# Patient Record
Sex: Female | Born: 1945 | Race: White | Hispanic: No | Marital: Married | Smoking: Never smoker
Health system: Southern US, Community
[De-identification: ages and names within clinical notes are randomized; demographics above are authoritative.]

## PROBLEM LIST (undated history)

## (undated) DIAGNOSIS — F32A Depression, unspecified: Secondary | ICD-10-CM

## (undated) DIAGNOSIS — F329 Major depressive disorder, single episode, unspecified: Secondary | ICD-10-CM

## (undated) DIAGNOSIS — D649 Anemia, unspecified: Secondary | ICD-10-CM

## (undated) DIAGNOSIS — I10 Essential (primary) hypertension: Secondary | ICD-10-CM

## (undated) DIAGNOSIS — R001 Bradycardia, unspecified: Secondary | ICD-10-CM

## (undated) DIAGNOSIS — Z950 Presence of cardiac pacemaker: Secondary | ICD-10-CM

## (undated) HISTORY — PX: INSERT / REPLACE / REMOVE PACEMAKER: SUR710

## (undated) HISTORY — PX: ABDOMINAL HYSTERECTOMY: SHX81

## (undated) HISTORY — PX: ROTATOR CUFF REPAIR: SHX139

## (undated) HISTORY — PX: HERNIA REPAIR: SHX51

---

## 2015-08-06 ENCOUNTER — Observation Stay: Payer: Medicare Other

## 2015-08-06 ENCOUNTER — Encounter
Admission: EM | Disposition: A | Discharge status: Other Acute Inpatient Hospital | Payer: Self-pay | Source: Home / Self Care | Attending: Internal Medicine

## 2015-08-06 ENCOUNTER — Emergency Department: Payer: Medicare Other

## 2015-08-06 ENCOUNTER — Observation Stay (HOSPITAL_COMMUNITY)
Admit: 2015-08-06 | Discharge: 2015-08-06 | Disposition: A | Discharge status: Home / Self Care | Payer: Medicare Other | Attending: Internal Medicine | Admitting: Internal Medicine

## 2015-08-06 ENCOUNTER — Inpatient Hospital Stay
Admission: EM | Admit: 2015-08-06 | Discharge: 2015-08-07 | DRG: 287 | Disposition: A | Discharge status: Other Acute Inpatient Hospital | Payer: Medicare Other | Attending: Internal Medicine | Admitting: Internal Medicine

## 2015-08-06 ENCOUNTER — Encounter: Payer: Self-pay | Admitting: Emergency Medicine

## 2015-08-06 DIAGNOSIS — I442 Atrioventricular block, complete: Principal | ICD-10-CM | POA: Diagnosis present

## 2015-08-06 DIAGNOSIS — Z90710 Acquired absence of both cervix and uterus: Secondary | ICD-10-CM | POA: Diagnosis not present

## 2015-08-06 DIAGNOSIS — I455 Other specified heart block: Secondary | ICD-10-CM | POA: Diagnosis not present

## 2015-08-06 DIAGNOSIS — R079 Chest pain, unspecified: Secondary | ICD-10-CM | POA: Diagnosis present

## 2015-08-06 DIAGNOSIS — Z7982 Long term (current) use of aspirin: Secondary | ICD-10-CM

## 2015-08-06 DIAGNOSIS — Z79899 Other long term (current) drug therapy: Secondary | ICD-10-CM | POA: Diagnosis not present

## 2015-08-06 DIAGNOSIS — R55 Syncope and collapse: Secondary | ICD-10-CM | POA: Diagnosis present

## 2015-08-06 DIAGNOSIS — R002 Palpitations: Secondary | ICD-10-CM | POA: Diagnosis not present

## 2015-08-06 DIAGNOSIS — I495 Sick sinus syndrome: Secondary | ICD-10-CM | POA: Diagnosis not present

## 2015-08-06 DIAGNOSIS — R200 Anesthesia of skin: Secondary | ICD-10-CM | POA: Diagnosis not present

## 2015-08-06 DIAGNOSIS — R634 Abnormal weight loss: Secondary | ICD-10-CM | POA: Diagnosis not present

## 2015-08-06 DIAGNOSIS — R109 Unspecified abdominal pain: Secondary | ICD-10-CM

## 2015-08-06 DIAGNOSIS — R001 Bradycardia, unspecified: Secondary | ICD-10-CM | POA: Diagnosis not present

## 2015-08-06 DIAGNOSIS — I200 Unstable angina: Secondary | ICD-10-CM | POA: Diagnosis present

## 2015-08-06 DIAGNOSIS — I10 Essential (primary) hypertension: Secondary | ICD-10-CM | POA: Diagnosis present

## 2015-08-06 DIAGNOSIS — Z8249 Family history of ischemic heart disease and other diseases of the circulatory system: Secondary | ICD-10-CM | POA: Diagnosis not present

## 2015-08-06 DIAGNOSIS — I25110 Atherosclerotic heart disease of native coronary artery with unstable angina pectoris: Secondary | ICD-10-CM

## 2015-08-06 HISTORY — PX: CARDIAC CATHETERIZATION: SHX172

## 2015-08-06 HISTORY — DX: Essential (primary) hypertension: I10

## 2015-08-06 LAB — BASIC METABOLIC PANEL
Anion gap: 8 (ref 5–15)
BUN: 16 mg/dL (ref 6–20)
CHLORIDE: 104 mmol/L (ref 101–111)
CO2: 26 mmol/L (ref 22–32)
Calcium: 9.2 mg/dL (ref 8.9–10.3)
Creatinine, Ser: 0.6 mg/dL (ref 0.44–1.00)
GFR calc non Af Amer: >60 mL/min (ref >60)
Glucose, Bld: 128 mg/dL — ABNORMAL HIGH (ref 65–99)
POTASSIUM: 3.8 mmol/L (ref 3.5–5.1)
SODIUM: 138 mmol/L (ref 135–145)

## 2015-08-06 LAB — TROPONIN I
TROPONIN I: 0.03 ng/mL (ref <0.031)
Troponin I: 0.03 ng/mL (ref <0.031)
Troponin I: <0.03 ng/mL (ref <0.031)
Troponin I: 0.03 ng/mL (ref <0.031)

## 2015-08-06 LAB — MAGNESIUM: Magnesium: 1.9 mg/dL (ref 1.7–2.4)

## 2015-08-06 LAB — T4, FREE: FREE T4: 0.91 ng/dL (ref 0.61–1.12)

## 2015-08-06 LAB — CBC
HEMATOCRIT: 39.9 % (ref 35.0–47.0)
HEMOGLOBIN: 13.9 g/dL (ref 12.0–16.0)
MCH: 31.4 pg (ref 26.0–34.0)
MCHC: 34.8 g/dL (ref 32.0–36.0)
MCV: 90.1 fL (ref 80.0–100.0)
Platelets: 220 10*3/uL (ref 150–440)
RBC: 4.42 MIL/uL (ref 3.80–5.20)
RDW: 13.3 % (ref 11.5–14.5)
WBC: 6.7 10*3/uL (ref 3.6–11.0)

## 2015-08-06 LAB — TSH: TSH: 4.672 u[IU]/mL — ABNORMAL HIGH (ref 0.350–4.500)

## 2015-08-06 LAB — GLUCOSE, CAPILLARY: GLUCOSE-CAPILLARY: 97 mg/dL (ref 65–99)

## 2015-08-06 SURGERY — LEFT HEART CATH AND CORONARY ANGIOGRAPHY
Anesthesia: Moderate Sedation

## 2015-08-06 MED ORDER — SODIUM CHLORIDE 0.9% FLUSH: 3.0000 mL | INTRAVENOUS | Status: DC | PRN

## 2015-08-06 MED ORDER — ENOXAPARIN SODIUM 40 MG/0.4ML ~~LOC~~ SOLN
40.0000 mg | SUBCUTANEOUS | Status: DC
  Administered 2015-08-06: 40 mg via SUBCUTANEOUS
  Filled 2015-08-06: qty 0.4

## 2015-08-06 MED ORDER — SODIUM CHLORIDE 0.9 % WEIGHT BASED INFUSION: 1.0000 mL/kg/h | INTRAVENOUS | Status: DC

## 2015-08-06 MED ORDER — MIDAZOLAM HCL 2 MG/2ML IJ SOLN
INTRAMUSCULAR | Status: DC | PRN
  Administered 2015-08-06: 1 mg via INTRAVENOUS

## 2015-08-06 MED ORDER — ZOLPIDEM TARTRATE 5 MG PO TABS
10.0000 mg | ORAL_TABLET | Freq: Every day | ORAL | Status: DC
  Administered 2015-08-06: 10 mg via ORAL
  Filled 2015-08-06: qty 2

## 2015-08-06 MED ORDER — ONDANSETRON HCL 4 MG/2ML IJ SOLN
4.0000 mg | Freq: Four times a day (QID) | INTRAMUSCULAR | Status: DC | PRN
Start: 2015-08-06 — End: 2015-08-07
  Administered 2015-08-06: 4 mg via INTRAVENOUS
  Filled 2015-08-06: qty 2

## 2015-08-06 MED ORDER — FENTANYL CITRATE (PF) 100 MCG/2ML IJ SOLN
INTRAMUSCULAR | Status: DC | PRN
  Administered 2015-08-06: 25 ug via INTRAVENOUS

## 2015-08-06 MED ORDER — SODIUM CHLORIDE 0.9 % IV SOLN: 250.0000 mL | INTRAVENOUS | Status: DC | PRN

## 2015-08-06 MED ORDER — MORPHINE SULFATE (PF) 2 MG/ML IV SOLN
2.0000 mg | INTRAVENOUS | Status: DC | PRN
  Administered 2015-08-06: 2 mg via INTRAVENOUS
  Filled 2015-08-06: qty 1

## 2015-08-06 MED ORDER — ACETAMINOPHEN 325 MG PO TABS
650.0000 mg | ORAL_TABLET | ORAL | Status: DC | PRN
  Administered 2015-08-06: 650 mg via ORAL
  Filled 2015-08-06: qty 2

## 2015-08-06 MED ORDER — MORPHINE SULFATE (PF) 2 MG/ML IV SOLN
2.0000 mg | INTRAVENOUS | Status: DC | PRN
  Administered 2015-08-06 – 2015-08-07 (×6): 2 mg via INTRAVENOUS
  Filled 2015-08-06 (×6): qty 1

## 2015-08-06 MED ORDER — SODIUM CHLORIDE 0.9% FLUSH
3.0000 mL | Freq: Two times a day (BID) | INTRAVENOUS | Status: DC
  Administered 2015-08-06 – 2015-08-07 (×2): 3 mL via INTRAVENOUS

## 2015-08-06 MED ORDER — ONDANSETRON HCL 4 MG/2ML IJ SOLN
4.0000 mg | Freq: Once | INTRAMUSCULAR | Status: AC
  Administered 2015-08-06: 4 mg via INTRAVENOUS
  Filled 2015-08-06: qty 2

## 2015-08-06 MED ORDER — MIDAZOLAM HCL 2 MG/2ML IJ SOLN
INTRAMUSCULAR | Status: AC
  Filled 2015-08-06: qty 2

## 2015-08-06 MED ORDER — ASPIRIN 81 MG PO CHEW
81.0000 mg | CHEWABLE_TABLET | ORAL | Status: DC
  Filled 2015-08-06: qty 1

## 2015-08-06 MED ORDER — SODIUM CHLORIDE 0.9 % WEIGHT BASED INFUSION: 3.0000 mL/kg/h | INTRAVENOUS | Status: DC

## 2015-08-06 MED ORDER — VERAPAMIL HCL ER 120 MG PO TBCR
240.0000 mg | EXTENDED_RELEASE_TABLET | Freq: Every day | ORAL | Status: DC
  Administered 2015-08-06: 240 mg via ORAL
  Filled 2015-08-06: qty 2

## 2015-08-06 MED ORDER — ONDANSETRON HCL 4 MG/2ML IJ SOLN
4.0000 mg | Freq: Once | INTRAMUSCULAR | Status: AC
  Administered 2015-08-06: 4 mg via INTRAVENOUS

## 2015-08-06 MED ORDER — SODIUM CHLORIDE 0.9 % IV BOLUS (SEPSIS)
250.0000 mL | Freq: Once | INTRAVENOUS | Status: AC
  Administered 2015-08-06: 250 mL via INTRAVENOUS

## 2015-08-06 MED ORDER — SODIUM CHLORIDE 0.9 % IV SOLN
INTRAVENOUS | Status: AC
Start: 2015-08-06 — End: 2015-08-06
  Administered 2015-08-06: 19:00:00 via INTRAVENOUS

## 2015-08-06 MED ORDER — ASPIRIN EC 81 MG PO TBEC
81.0000 mg | DELAYED_RELEASE_TABLET | Freq: Every day | ORAL | Status: DC
  Administered 2015-08-06 – 2015-08-07 (×2): 81 mg via ORAL
  Filled 2015-08-06 (×2): qty 1

## 2015-08-06 MED ORDER — IOHEXOL 300 MG/ML  SOLN
INTRAMUSCULAR | Status: DC | PRN
  Administered 2015-08-06: 50 mL via INTRA_ARTERIAL

## 2015-08-06 MED ORDER — HEPARIN (PORCINE) IN NACL 2-0.9 UNIT/ML-% IJ SOLN
INTRAMUSCULAR | Status: AC
  Filled 2015-08-06: qty 1000

## 2015-08-06 MED ORDER — SODIUM CHLORIDE 0.9% FLUSH: 3.0000 mL | Freq: Two times a day (BID) | INTRAVENOUS | Status: DC

## 2015-08-06 MED ORDER — PANTOPRAZOLE SODIUM 40 MG PO TBEC
40.0000 mg | DELAYED_RELEASE_TABLET | Freq: Every day | ORAL | Status: DC
  Administered 2015-08-06 – 2015-08-07 (×2): 40 mg via ORAL
  Filled 2015-08-06 (×3): qty 1

## 2015-08-06 MED ORDER — FENTANYL CITRATE (PF) 100 MCG/2ML IJ SOLN
INTRAMUSCULAR | Status: AC
  Filled 2015-08-06: qty 2

## 2015-08-06 SURGICAL SUPPLY — 12 items
CABLE ADAPT CONN TEMP 6FT (ADAPTER) ×2 IMPLANT
CATH INFINITI 5FR ANG PIGTAIL (CATHETERS) ×2 IMPLANT
CATH INFINITI 5FR JL4 (CATHETERS) ×2 IMPLANT
CATH INFINITI JR4 5F (CATHETERS) ×2 IMPLANT
DEVICE CLOSURE MYNXGRIP 5F (Vascular Products) ×2 IMPLANT
KIT MANI 3VAL PERCEP (MISCELLANEOUS) ×2 IMPLANT
NEEDLE PERC 18GX7CM (NEEDLE) ×2 IMPLANT
PACK CARDIAC CATH (CUSTOM PROCEDURE TRAY) ×2 IMPLANT
SHEATH PINNACLE 5F 10CM (SHEATH) ×4 IMPLANT
SLEEVE REPOSITIONING LENGTH 30 (MISCELLANEOUS) ×2 IMPLANT
WIRE EMERALD 3MM-J .035X150CM (WIRE) ×2 IMPLANT
WIRE PACING TEMP ST TIP 5 (CATHETERS) ×2 IMPLANT

## 2015-08-06 NOTE — [place] Notes (Signed)
   Patient was in sinus rhythm with heart rate of 66 bpm on 2A this afternoon. She briefly brady'd down to a heart rate in the 30's immediately followed by a 17 second pause. 2A staff applied sternal rub and patient responded. Pacer pads were put in place. Since this event she has been in sinus rhythm in the 60's. This is her second brady'ing down event today leading to significant pause, initially occuring in the ED. This strip did possibly have some artifact at the end of the strip. Case was discussed with patient's family members in her room. She was transferred to ICU. Troponin continues to be negative, now negative x 2. Orders were placed for urgent cardiac cath with plans for venous temp wire.

## 2015-08-06 NOTE — Care Management (Signed)
Patient presented to Ed with  one month duration ofchest pain.  She has  familial history of sudden cardiac death.  Patient placed in observation due to rule out MI and due to syncope/presyyncope due to bradycardia.  it is documented that patient brady 'ed down to the 30's and eventually became asystolic.  Sternal rub applied and patient responded.  Pacer pads in place.  Upon arriving the 2A, patient had a 17 second pause on telemetry.

## 2015-08-06 NOTE — [place] Notes (Signed)
RN paged Dr. Kirke Corin and made MD aware that patient has ridge noted to right groin and RN is concerned about a hematoma. Area of concern marked with skin marker. Dr. Kirke Corin came to bedside and assessing patient.  No orders at this time.

## 2015-08-06 NOTE — ED Notes (Addendum)
Pt with bradycardic episode down to asystole; pt with agonal respirations in room, unresponsive. MD and code cart to bedside, sternal rub performed. Pt HR back to 70, RBB. Pt alert, reports nausea. Defib pads placed on patient.

## 2015-08-06 NOTE — [place] Notes (Signed)
RN made Dr. Kirke Corin aware that patient is complaining of pain to right groin and nothing is ordered for pain.  MD gave order for PRN morphine.

## 2015-08-06 NOTE — [place] Notes (Signed)
Dr. Kirke Corin gave order to continue to monitor hematoma to right groin. This RN and Marcelino Duster, RN both assessed groin together during shift change report. Family at bedside.

## 2015-08-06 NOTE — H&P (Addendum)
[facility] - [place] at [facility]   PATIENT NAME: Destiny Lutz    MR#:  [id number]  DATE OF BIRTH:  March 08, 1946  DATE OF ADMISSION:  08/06/2015  PRIMARY CARE PHYSICIAN: No PCP Per Patient   REQUESTING/REFERRING PHYSICIAN: Myrna Blazer, MD  CHIEF COMPLAINT:   Chief Complaint  Patient presents with  . Chest Pain    HISTORY OF PRESENT ILLNESS:  Destiny Lutz  is a 70 y.o. female with a known history of HTN and prior syncope several years ago presented to [facility] ED on 2/6 with a one month history of substernal chest pain radiating to the bilateral axilla.  She woke up with chest pain last night, described her chest pain 8 out of 10 at onset but now it is 1 out of 10 after nitroglycerin.  Some associated shortness of breath along with nausea.  She lost her father to lung cancer 1 year ago and continues to acutely heal from this loss. She recently stopped taking her antianxiety medication. She has been having nonexertional chest pain over the past month.  While in the ED she brady'ed down to the 30's, eventually becoming asystolic. She came to with a sternal rub. Pacer pads were applied. Since then she has remained in sinus rhythm with heart rate in the 60's.  She had another episode of asymptomatic pause of 5-10 seconds on in the ED.  She feels nauseous. PAST MEDICAL HISTORY:   Past Medical History  Diagnosis Date  . Hypertension     PAST SURGICAL HISTORY:   Past Surgical History  Procedure Laterality Date  . Hernia repair    . Abdominal hysterectomy    . Rotator cuff repair Right     SOCIAL HISTORY:   Social History  Substance Use Topics  . Smoking status: Never Smoker   . Smokeless tobacco: Not on file  . Alcohol Use: No    FAMILY HISTORY:   Family History  Problem Relation Age of Onset  . CAD Mother   . Hypertension Mother   . Diabetes Mother   . Lung cancer Father     DRUG ALLERGIES:  No Known Allergies REVIEW OF  SYSTEMS:   Review of Systems  Constitutional: Negative for fever, weight loss, malaise/fatigue and diaphoresis.  HENT: Negative for ear discharge, ear pain, hearing loss, nosebleeds, sore throat and tinnitus.   Eyes: Negative for blurred vision and pain.  Respiratory: Positive for shortness of breath. Negative for cough, hemoptysis and wheezing.   Cardiovascular: Positive for chest pain. Negative for palpitations, orthopnea and leg swelling.  Gastrointestinal: Negative for heartburn, nausea, vomiting, abdominal pain, diarrhea, constipation and blood in stool.  Genitourinary: Negative for dysuria, urgency and frequency.  Musculoskeletal: Negative for myalgias and back pain.  Skin: Negative for itching and rash.  Neurological: Negative for dizziness, tingling, tremors, focal weakness, seizures, weakness and headaches.  Psychiatric/Behavioral: Positive for depression. The patient is not nervous/anxious.     MEDICATIONS AT HOME:   Prior to Admission medications   Medication Sig Start Date End Date Taking? Authorizing Provider  aspirin EC 81 MG tablet Take 81 mg by mouth daily.   Yes Historical Provider, MD  pantoprazole (PROTONIX) 40 MG tablet Take 40 mg by mouth daily. 07/18/15  Yes Historical Provider, MD  verapamil (CALAN-SR) 240 MG CR tablet Take 240 mg by mouth daily. 07/19/15  Yes Historical Provider, MD  zolpidem (AMBIEN) 10 MG tablet Take 10 mg by mouth at bedtime. 07/03/15  Yes Historical Provider, MD  VITAL SIGNS:  Blood pressure 149/73, pulse 72, temperature 98 F (36.7 C), temperature source Oral, resp. rate 18, height  (1.651 m), weight 72.576 kg (160 lb), SpO2 97 %.  PHYSICAL EXAMINATION:  Physical Exam  GENERAL:  70 y.o.-year-old patient lying in the bed with no acute distress.  EYES: Pupils equal, round, reactive to light and accommodation. No scleral icterus. Extraocular muscles intact.  HEENT: Head atraumatic, normocephalic. Oropharynx and nasopharynx clear.   NECK:  Supple, no jugular venous distention. No thyroid enlargement, no tenderness.  LUNGS: Normal breath sounds bilaterally, no wheezing, rales,rhonchi or crepitation. No use of accessory muscles of respiration.  CARDIOVASCULAR: S1, S2 normal. No murmurs, rubs, or gallops.  ABDOMEN: Soft, nontender, nondistended. Bowel sounds present. No organomegaly or mass.  EXTREMITIES: No pedal edema, cyanosis, or clubbing.  NEUROLOGIC: Cranial nerves II through XII are intact. Muscle strength 5/5 in all extremities. Sensation intact. Gait not checked.  PSYCHIATRIC: The patient is alert and oriented x 3.  SKIN: No obvious rash, lesion, or ulcer.   LABORATORY PANEL:   CBC  Recent Labs Lab 08/06/15 0928  WBC 6.7  HGB 13.9  HCT 39.9  PLT 220   ------------------------------------------------------------------------------------------------------------------  Chemistries   Recent Labs Lab 08/06/15 0928  NA 138  K 3.8  CL 104  CO2 26  GLUCOSE 128*  BUN 16  CREATININE 0.60  CALCIUM 9.2   ------------------------------------------------------------------------------------------------------------------  Cardiac Enzymes  Recent Labs Lab 08/06/15 0928  TROPONINI 0.03   ------------------------------------------------------------------------------------------------------------------  RADIOLOGY:  Dg Chest 2 View  08/06/2015  CLINICAL DATA:  Chest pain EXAM: CHEST  2 VIEW COMPARISON:  None. FINDINGS: Heart size and vascularity normal. Lungs are clear without infiltrate or effusion. No mass lesion. Mild to moderate hiatal hernia. IMPRESSION: No active cardiopulmonary disease. Electronically Signed   By: Marlan Palau M.D.   On: 08/06/2015 09:49      IMPRESSION AND PLAN:  * Chest pain: Likely noncardiac, although we will rule her out with serial troponins .  Considering her positive family history for early cardiac death, monitor on telemetry, continue aspirin  * History of  presyncope/syncope with symptomatic bradycardia Monitor on telemetry.  Cardiology consultation May need pacemaker  * Hypertension, stable.  We will continue home medications  * Elevated TSH: Checking free T3 and T4    All the records are reviewed and case discussed with ED provider. Management plans discussed with the patient, family and they are in agreement.  CODE STATUS: Full code  TOTAL TIME TAKING CARE OF THIS PATIENT: 45 minutes.    [facility], Pierre Cumpton M.D on 08/06/2015 at 12:38 PM  Between 7am to 6pm - Pager - [phone]  After 6pm go to [email or web address] - password [email or web address] [facility]  [facility] [place]  Office  [phone]  CC: Primary care physician; No PCP Per Patient Rachel Moulds MD  Note: This dictation was prepared with Dragon dictation along with smaller phrase technology. Any transcriptional errors that result from this process are unintentional.

## 2015-08-06 NOTE — [place] (Signed)
*  PRELIMINARY RESULTS* Echocardiogram 2D Echocardiogram has been performed.  Destiny Lutz 08/06/2015, 2:46 PM

## 2015-08-06 NOTE — [place] Notes (Addendum)
Patient has a 17.36 sec pause , DR Sherryll Burger was made aware and order for patient to be transfer to ICU,  Patient was unresponsive for a few second with sternum rub done  during the episode , vs done see flow sheet , awaiting bed in ICU, will continue to monitor

## 2015-08-06 NOTE — ED Notes (Signed)
Pt to xray

## 2015-08-06 NOTE — ED Notes (Signed)
Pt here from home via [facility] with c/o of chest pain that started last night, radiates to right arm and back. EMS reports pt with RBB, hx of the same. Pt points to epigastric area when asked to locate pain. Pt also reports left flank pain that radiates to back, denies any urinary symptoms at present. EMS gave 4 ASA, 1 nitro and  zofran IV.

## 2015-08-06 NOTE — [place] Notes (Signed)
Assessed groin site and hematoma with Brittney, RN from day shift.  Hematoma remains unchanged since MD came and assessed earlier, dressing is in tact, hematoma outlined with skin maker, pulses palpable.  Patient given morphine and stated pain is decreased.  Will continue to monitor throughout the night.

## 2015-08-06 NOTE — Consult Note (Signed)
Cardiology Consultation Note  Patient ID: Destiny Lutz, MRN: [id number], DOB/AGE: 1946-01-28 70 y.o. Admit date: 08/06/2015   Date of Consult: 08/06/2015 Primary Physician: No PCP Per Patient Primary Cardiologist: New to [facility]  Chief Complaint: Chest pain Reason for Consult: Chest pain  HPI: 70 y.o. female with h/o HTN and prior syncope several years ago presented to [facility] ED on 2/6 with a one month history of substernal chest pain radiating to the bilateral axilla.   She denies any prior known cardiac history. She does report having had a prior stress test done through her PCP several years back 2/2 syncope. No prior echocardiograms, outpatient cardiac monitoring, or cardiac caths. She lost her father to lung cancer 1 year ago and continues to acutely heal from this loss. She recently stopped taking her antianxiety medication. She has been having nonexertional chest pain over the past month. While exerting herself she does not have this pain. Some associated nausea, o/w no associated symptoms. Her pain worsened the prior evening prompting her to come to the ED.   Upon the patient's arrival to [facility] they were found to have negative troponin x 1, TSH 4.672, unremarkable CBC, unremarkable bmet outside of glucose of 128. ECG showed NSR, 67 bpm, RBBB, CXR showed no active cardiopulmonary disease. While in the ED she brady'ed down to the 30's, eventually becoming asystolic. She came to with a sternal rub. Pacer pads were applied. Since then she has remained in sinus rhythm with heart rate in the 60's. She is asymptomatic currently.    Past Medical History  Diagnosis Date  . Hypertension       Most Recent Cardiac Studies: None   Surgical History:  Past Surgical History  Procedure Laterality Date  . Hernia repair    . Abdominal hysterectomy    . Rotator cuff repair Right      Home Meds: Prior to Admission medications   Medication Sig Start Date End Date Taking? Authorizing Provider  aspirin  EC 81 MG tablet Take 81 mg by mouth daily.   Yes Historical Provider, MD  pantoprazole (PROTONIX) 40 MG tablet Take 40 mg by mouth daily. 07/18/15  Yes Historical Provider, MD  verapamil (CALAN-SR) 240 MG CR tablet Take 240 mg by mouth daily. 07/19/15  Yes Historical Provider, MD  zolpidem (AMBIEN) 10 MG tablet Take 10 mg by mouth at bedtime. 07/03/15  Yes Historical Provider, MD    Inpatient Medications:  . ondansetron (ZOFRAN) IV  4 mg Intravenous Once      Allergies: No Known Allergies  Social History   Social History  . Marital Status: Married    Spouse Name: N/A  . Number of Children: N/A  . Years of Education: N/A   Occupational History  . Not on file.   Social History Main Topics  . Smoking status: Never Smoker   . Smokeless tobacco: Not on file  . Alcohol Use: No  . Drug Use: Not on file  . Sexual Activity: Not on file   Other Topics Concern  . Not on file   Social History Narrative  . No narrative on file     Family History  Problem Relation Age of Onset  . CAD Mother   . Hypertension Mother   . Diabetes Mother   . Lung cancer Father      Review of Systems: Review of Systems  Constitutional: Positive for weight loss and malaise/fatigue. Negative for fever, chills and diaphoresis.  HENT: Negative for congestion.   Eyes:  Negative for blurred vision, discharge and redness.  Respiratory: Negative for cough, hemoptysis, sputum production, shortness of breath and wheezing.   Cardiovascular: Positive for chest pain. Negative for palpitations, orthopnea, claudication, leg swelling and PND.  Gastrointestinal: Positive for nausea. Negative for heartburn, vomiting, abdominal pain, diarrhea, constipation, blood in stool and melena.  Musculoskeletal: Negative for myalgias, back pain, joint pain, falls and neck pain.  Skin: Negative for rash.  Neurological: Positive for dizziness, loss of consciousness and weakness. Negative for tingling, tremors, sensory change, speech  change, focal weakness and seizures.  Endo/Heme/Allergies: Does not bruise/bleed easily.  Psychiatric/Behavioral: Positive for depression. Negative for suicidal ideas, hallucinations, memory loss and substance abuse. The patient is nervous/anxious. The patient does not have insomnia.   All other systems reviewed and are negative.    Labs:  Recent Labs  08/06/15 0928  TROPONINI 0.03   Lab Results  Component Value Date   WBC 6.7 08/06/2015   HGB 13.9 08/06/2015   HCT 39.9 08/06/2015   MCV 90.1 08/06/2015   PLT 220 08/06/2015     Recent Labs Lab 08/06/15 0928  NA 138  K 3.8  CL 104  CO2 26  BUN 16  CREATININE 0.60  CALCIUM 9.2  GLUCOSE 128*   No results found for: CHOL, HDL, LDLCALC, TRIG No results found for: DDIMER  Radiology/Studies:  Dg Chest 2 View  08/06/2015  CLINICAL DATA:  Chest pain EXAM: CHEST  2 VIEW COMPARISON:  None. FINDINGS: Heart size and vascularity normal. Lungs are clear without infiltrate or effusion. No mass lesion. Mild to moderate hiatal hernia. IMPRESSION: No active cardiopulmonary disease. Electronically Signed   By: Marlan Palau M.D.   On: 08/06/2015 09:49    EKG: NSR, 67 bpm, RBBB  Weights: Filed Weights   08/06/15 0926  Weight: 160 lb (72.576 kg)     Physical Exam: Blood pressure 145/76, pulse 67, temperature 98.1 F (36.7 C), temperature source Oral, resp. rate 15, height  (1.651 m), weight 160 lb (72.576 kg), SpO2 95 %. Body mass index is 26.63 kg/(m^2). General: Well developed, well nourished, in no acute distress. Head: Normocephalic, atraumatic, sclera non-icteric, no xanthomas, nares are without discharge.  Neck: Negative for carotid bruits. JVD not elevated. Lungs: Clear bilaterally to auscultation without wheezes, rales, or rhonchi. Breathing is unlabored. Heart: RRR with S1 S2. No murmurs, rubs, or gallops appreciated. Abdomen: Soft, non-tender, non-distended with normoactive bowel sounds. No hepatomegaly. No  rebound/guarding. No obvious abdominal masses. Msk:  Strength and tone appear normal for age. Extremities: No clubbing or cyanosis. No edema.  Distal pedal pulses are 2+ and equal bilaterally. Neuro: Alert and oriented X 3. No facial asymmetry. No focal deficit. Moves all extremities spontaneously. Psych:  Responds to questions appropriately with a normal affect.    Assessment and Plan:   1. Chest pain: -Troponin negative x 1 thus far, continue to cycle -Check echo to evaluate LV systolic function, wall motion, right-sided pressure, and RV size -If troponin remains negative schedule nuclear stress test  2. History of syncope with symptomatic bradycardia: -Monitor on tele -It is unclear if this is what happened to her in the past -Check Mg++ -Possibly needs cardiac cath to rule out ischemia given pause -Likely needs PPM -Pacer pads in place  3. HTN:  -Continue current medications  4. Elevated TSH: -Add free T4   Elinor Dodge, PA-C Pager: [phone] 08/06/2015, 12:07 PM

## 2015-08-06 NOTE — ED Provider Notes (Signed)
[facility] Emergency Department Provider Note  ____________________________________________  Time seen: 43  I have reviewed the triage vital signs and the nursing notes.   HISTORY  Chief Complaint Chest Pain    HPI Destiny Lutz is a 70 y.o. female with a history of hypertension who is presenting today with chest pain since last night. She describes the chest pain as left-sided and as a heaviness. It was initially 6 out of 10 but en route was given nitroglycerin sublingual by the medics and the pain reduced to a 1 out of 10. She had some associated shortness of breath this morning which is now abated. The pain has been radiating to her bilateral armpits. Associated nausea but no vomiting. Was given 4x81 mg aspirins in route.Has a history of heart disease in her family with her mother dying in her 95s of a heart attack.   Past Medical History  Diagnosis Date  . Hypertension     There are no active problems to display for this patient.   Past Surgical History  Procedure Laterality Date  . Hernia repair    . Abdominal hysterectomy    . Rotator cuff repair Right     Current Outpatient Rx  Name  Route  Sig  Dispense  Refill  . pantoprazole (PROTONIX) 40 MG tablet   Oral   Take 40 mg by mouth daily.      5   . verapamil (CALAN-SR) 240 MG CR tablet   Oral   Take 240 mg by mouth daily.      4   . zolpidem (AMBIEN) 10 MG tablet   Oral   Take 10 mg by mouth at bedtime.      0     Allergies Review of patient's allergies indicates not on file.  No family history on file.  Social History Social History  Substance Use Topics  . Smoking status: Never Smoker   . Smokeless tobacco: None  . Alcohol Use: No    Review of Systems Constitutional: No fever/chills Eyes: No visual changes. ENT: No sore throat. Cardiovascular: As above Respiratory: As above Gastrointestinal: No abdominal pain.  No nausea, no vomiting.  No diarrhea.  No  constipation. Genitourinary: Negative for dysuria. Musculoskeletal: Negative for back pain. Skin: Negative for rash. Neurological: Negative for headaches, focal weakness or numbness.  10-point ROS otherwise negative.  ____________________________________________   PHYSICAL EXAM:  VITAL SIGNS: ED Triage Vitals  Enc Vitals Group     BP 08/06/15 0926 130/70 mmHg     Pulse Rate 08/06/15 0926 70     Resp 08/06/15 0926 24     Temp 08/06/15 0926 98.1 F (36.7 C)     Temp Source 08/06/15 0926 Oral     SpO2 08/06/15 0926 93 %     Weight 08/06/15 0926 160 lb (72.576 kg)     Height 08/06/15 0926  (1.651 m)     Head Cir --      Peak Flow --      Pain Score 08/06/15 0926 1     Pain Loc --      Pain Edu? --      Excl. in GC? --     Constitutional: Alert and oriented. Well appearing and in no acute distress. Eyes: Conjunctivae are normal. PERRL. EOMI. Head: Atraumatic. Nose: No congestion/rhinnorhea. Mouth/Throat: Mucous membranes are moist.  Oropharynx non-erythematous. Neck: No stridor.   Cardiovascular: Normal rate, regular rhythm. Grossly normal heart sounds.  Good peripheral circulation.  Chest pain is not reproducible to palpation. Intact in bilateral and equal dorsalis pedis as well as radial pulses. Respiratory: Normal respiratory effort.  No retractions. Lungs CTAB. Gastrointestinal: Soft and nontender. No distention. No abdominal bruits. No CVA tenderness. Musculoskeletal: No lower extremity tenderness nor edema.  No joint effusions. Neurologic:  Normal speech and language. No gross focal neurologic deficits are appreciated. No gait instability. Skin:  Skin is warm, dry and intact. No rash noted. Psychiatric: Mood and affect are normal. Speech and behavior are normal.  ____________________________________________   LABS (all labs ordered are listed, but only abnormal results are displayed)  Labs Reviewed  BASIC METABOLIC PANEL - Abnormal; Notable for the following:     Glucose, Bld 128 (*)    All other components within normal limits  TSH - Abnormal; Notable for the following:    TSH 4.672 (*)    All other components within normal limits  TROPONIN I  CBC   ____________________________________________  EKG  ED ECG REPORT I, Arelia Longest, the attending physician, personally viewed and interpreted this ECG.   Date: 08/06/2015  EKG Time: 9:25 AM  Rate: 67  Rhythm: normal sinus rhythm  Axis: Normal axis  Intervals:right bundle branch block  ST&T Change: No ST segment elevation or depression. Biphasic T waves in V3. Known right bundle branch block per her daughter who is at the bedside.  ED ECG REPORT I, Arelia Longest, the attending physician, personally viewed and interpreted this ECG.   Date: 08/06/2015  EKG Time: 10:37 AM  Rate: 63  Rhythm: normal sinus rhythm  Axis: Normal axis   Intervals:none  ST&T Change: No ST segment elevation or depression. No T-wave inversions that are new from previous.  ____________________________________________  RADIOLOGY  No active cardiopulmonary disease on the chest x-ray. ____________________________________________   PROCEDURES  ____________________________________________   INITIAL IMPRESSION / ASSESSMENT AND PLAN / ED COURSE  Pertinent labs & imaging results that were available during my care of the patient were reviewed by me and considered in my medical decision making (see chart for details).  ----------------------------------------- 11:04 AM on 08/06/2015 -----------------------------------------  Patient had an episode in the emergency department where she had bradycardia down to a long pause and had a episode of unconsciousness was lasted about 5-10 seconds. Patient said that she felt very nauseous prior to the episode. Return to her baseline very quickly. Was placed on the pacer pads. Denies any worsening of her pain and still says that the pain is "just slight."  Repeat EKG without any changes. We'll admit to the hospital for further workup. Signed out to Dr. Sherryll Burger.  Patient aware of need for admission. Discussed case with the patient as well as the family who are aware of the plan and willing to comply. ____________________________________________   FINAL CLINICAL IMPRESSION(S) / ED DIAGNOSES  Chest pain. Syncope.    Myrna Blazer, MD 08/06/15 [id number]

## 2015-08-06 NOTE — [place] Notes (Signed)
Patient transferred to cath lab with RN and Kathlene November, orderly. Report given to Shawn Route, RN.

## 2015-08-06 NOTE — ED Notes (Signed)
Pt's oxygen saturation at 88-90% on RA, pt placed on 2L via [place].

## 2015-08-07 ENCOUNTER — Ambulatory Visit (HOSPITAL_COMMUNITY): Admit: 2015-08-07 | Payer: Self-pay | Admitting: Internal Medicine

## 2015-08-07 ENCOUNTER — Inpatient Hospital Stay (HOSPITAL_COMMUNITY)
Admission: AD | Admit: 2015-08-07 | Discharge: 2015-08-08 | DRG: 244 | Disposition: A | Discharge status: Home / Self Care | Payer: Medicare Other | Source: Other Acute Inpatient Hospital | Attending: Internal Medicine | Admitting: Internal Medicine

## 2015-08-07 ENCOUNTER — Encounter
Admission: EM | Disposition: A | Discharge status: Other Acute Inpatient Hospital | Payer: Self-pay | Source: Home / Self Care | Attending: Internal Medicine

## 2015-08-07 ENCOUNTER — Encounter: Payer: Self-pay | Admitting: Cardiovascular Disease

## 2015-08-07 ENCOUNTER — Encounter (HOSPITAL_COMMUNITY)
Admission: AD | Disposition: A | Discharge status: Home / Self Care | Payer: Self-pay | Source: Other Acute Inpatient Hospital | Attending: Internal Medicine

## 2015-08-07 DIAGNOSIS — Z90710 Acquired absence of both cervix and uterus: Secondary | ICD-10-CM

## 2015-08-07 DIAGNOSIS — I495 Sick sinus syndrome: Secondary | ICD-10-CM

## 2015-08-07 DIAGNOSIS — R002 Palpitations: Secondary | ICD-10-CM | POA: Diagnosis present

## 2015-08-07 DIAGNOSIS — R001 Bradycardia, unspecified: Secondary | ICD-10-CM | POA: Diagnosis present

## 2015-08-07 DIAGNOSIS — I442 Atrioventricular block, complete: Principal | ICD-10-CM | POA: Diagnosis present

## 2015-08-07 DIAGNOSIS — R634 Abnormal weight loss: Secondary | ICD-10-CM | POA: Diagnosis present

## 2015-08-07 DIAGNOSIS — I10 Essential (primary) hypertension: Secondary | ICD-10-CM | POA: Diagnosis present

## 2015-08-07 DIAGNOSIS — Z7982 Long term (current) use of aspirin: Secondary | ICD-10-CM

## 2015-08-07 DIAGNOSIS — R200 Anesthesia of skin: Secondary | ICD-10-CM | POA: Diagnosis present

## 2015-08-07 DIAGNOSIS — Z8249 Family history of ischemic heart disease and other diseases of the circulatory system: Secondary | ICD-10-CM

## 2015-08-07 DIAGNOSIS — Z950 Presence of cardiac pacemaker: Secondary | ICD-10-CM

## 2015-08-07 DIAGNOSIS — Z79899 Other long term (current) drug therapy: Secondary | ICD-10-CM

## 2015-08-07 DIAGNOSIS — R55 Syncope and collapse: Secondary | ICD-10-CM

## 2015-08-07 HISTORY — DX: Presence of cardiac pacemaker: Z95.0

## 2015-08-07 HISTORY — DX: Anemia, unspecified: D64.9

## 2015-08-07 HISTORY — PX: EP IMPLANTABLE DEVICE: SHX172B

## 2015-08-07 HISTORY — DX: Depression, unspecified: F32.A

## 2015-08-07 HISTORY — DX: Major depressive disorder, single episode, unspecified: F32.9

## 2015-08-07 HISTORY — DX: Bradycardia, unspecified: R00.1

## 2015-08-07 LAB — T3, FREE: T3 FREE: 3.1 pg/mL (ref 2.0–4.4)

## 2015-08-07 LAB — CBC
HEMATOCRIT: 35.4 % (ref 35.0–47.0)
HEMOGLOBIN: 12 g/dL (ref 12.0–16.0)
MCH: 30.6 pg (ref 26.0–34.0)
MCHC: 33.9 g/dL (ref 32.0–36.0)
MCV: 90.4 fL (ref 80.0–100.0)
Platelets: 227 10*3/uL (ref 150–440)
RBC: 3.91 MIL/uL (ref 3.80–5.20)
RDW: 13.6 % (ref 11.5–14.5)
WBC: 12.6 10*3/uL — AB (ref 3.6–11.0)

## 2015-08-07 LAB — TROPONIN I: Troponin I: 0.03 ng/mL (ref <0.031)

## 2015-08-07 LAB — GLUCOSE, CAPILLARY: GLUCOSE-CAPILLARY: 97 mg/dL (ref 65–99)

## 2015-08-07 SURGERY — PACEMAKER IMPLANT

## 2015-08-07 MED ORDER — SODIUM CHLORIDE 0.9 % IV SOLN: INTRAVENOUS | Status: DC

## 2015-08-07 MED ORDER — CHLORHEXIDINE GLUCONATE 4 % EX LIQD
60.0000 mL | Freq: Once | CUTANEOUS | Status: AC
  Administered 2015-08-07: 4 via TOPICAL

## 2015-08-07 MED ORDER — PANTOPRAZOLE SODIUM 40 MG PO TBEC
40.0000 mg | DELAYED_RELEASE_TABLET | Freq: Every day | ORAL | Status: DC
  Administered 2015-08-07 – 2015-08-08 (×2): 40 mg via ORAL
  Filled 2015-08-07 (×2): qty 1

## 2015-08-07 MED ORDER — CEFAZOLIN SODIUM-DEXTROSE 2-3 GM-% IV SOLR
2.0000 g | Freq: Three times a day (TID) | INTRAVENOUS | Status: AC
  Administered 2015-08-08: 07:00:00 2 g via INTRAVENOUS
  Filled 2015-08-07 (×2): qty 50

## 2015-08-07 MED ORDER — VERAPAMIL HCL ER 240 MG PO TBCR
240.0000 mg | EXTENDED_RELEASE_TABLET | Freq: Every day | ORAL | Status: DC
  Administered 2015-08-07 – 2015-08-08 (×2): 240 mg via ORAL
  Filled 2015-08-07 (×2): qty 1

## 2015-08-07 MED ORDER — MIDAZOLAM HCL 5 MG/5ML IJ SOLN
INTRAMUSCULAR | Status: AC
  Filled 2015-08-07: qty 5

## 2015-08-07 MED ORDER — HYDROMORPHONE HCL 1 MG/ML IJ SOLN
1.0000 mg | INTRAMUSCULAR | Status: DC | PRN
  Administered 2015-08-07: 1 mg via INTRAVENOUS
  Filled 2015-08-07: qty 1

## 2015-08-07 MED ORDER — SODIUM CHLORIDE 0.9 % IV SOLN
INTRAVENOUS | Status: DC
  Administered 2015-08-07: 11:00:00 via INTRAVENOUS

## 2015-08-07 MED ORDER — FENTANYL CITRATE (PF) 100 MCG/2ML IJ SOLN
INTRAMUSCULAR | Status: DC | PRN
Start: 2015-08-07 — End: 2015-08-07
  Administered 2015-08-07 (×2): 12.5 ug via INTRAVENOUS

## 2015-08-07 MED ORDER — ZOLPIDEM TARTRATE 5 MG PO TABS
5.0000 mg | ORAL_TABLET | Freq: Every day | ORAL | Status: DC
  Administered 2015-08-07: 5 mg via ORAL
  Filled 2015-08-07: qty 1

## 2015-08-07 MED ORDER — SODIUM CHLORIDE 0.9 % IR SOLN
80.0000 mg | Status: AC
  Administered 2015-08-07: 80 mg

## 2015-08-07 MED ORDER — CEFAZOLIN SODIUM-DEXTROSE 2-3 GM-% IV SOLR
2.0000 g | INTRAVENOUS | Status: DC
  Filled 2015-08-07: qty 50

## 2015-08-07 MED ORDER — HEPARIN (PORCINE) IN NACL 2-0.9 UNIT/ML-% IJ SOLN
INTRAMUSCULAR | Status: DC | PRN
  Administered 2015-08-07: 16:00:00

## 2015-08-07 MED ORDER — FENTANYL CITRATE (PF) 100 MCG/2ML IJ SOLN
INTRAMUSCULAR | Status: AC
  Filled 2015-08-07: qty 2

## 2015-08-07 MED ORDER — LIDOCAINE HCL (PF) 1 % IJ SOLN
INTRAMUSCULAR | Status: DC | PRN
  Administered 2015-08-07: 31 mL via INTRADERMAL

## 2015-08-07 MED ORDER — SODIUM CHLORIDE 0.9 % IR SOLN
80.0000 mg | Status: DC
  Filled 2015-08-07: qty 2

## 2015-08-07 MED ORDER — CEFAZOLIN SODIUM-DEXTROSE 2-3 GM-% IV SOLR
2.0000 g | Freq: Three times a day (TID) | INTRAVENOUS | Status: DC
  Administered 2015-08-07: 2 g via INTRAVENOUS
  Filled 2015-08-07 (×2): qty 50

## 2015-08-07 MED ORDER — SODIUM CHLORIDE 0.9 % IR SOLN
Status: AC
  Filled 2015-08-07: qty 2

## 2015-08-07 MED ORDER — ACETAMINOPHEN 325 MG PO TABS
325.0000 mg | ORAL_TABLET | ORAL | Status: DC | PRN
  Administered 2015-08-07 – 2015-08-08 (×3): 650 mg via ORAL
  Filled 2015-08-07 (×3): qty 2

## 2015-08-07 MED ORDER — LIDOCAINE HCL (PF) 1 % IJ SOLN
INTRAMUSCULAR | Status: AC
  Filled 2015-08-07: qty 60

## 2015-08-07 MED ORDER — HEPARIN (PORCINE) IN NACL 2-0.9 UNIT/ML-% IJ SOLN
INTRAMUSCULAR | Status: AC
  Filled 2015-08-07: qty 500

## 2015-08-07 MED ORDER — MIDAZOLAM HCL 5 MG/5ML IJ SOLN
INTRAMUSCULAR | Status: DC | PRN
  Administered 2015-08-07 (×2): 1 mg via INTRAVENOUS

## 2015-08-07 MED ORDER — CHLORHEXIDINE GLUCONATE 4 % EX LIQD: 60.0000 mL | Freq: Once | CUTANEOUS | Status: DC

## 2015-08-07 MED ORDER — ONDANSETRON HCL 4 MG/2ML IJ SOLN
4.0000 mg | Freq: Four times a day (QID) | INTRAMUSCULAR | Status: DC | PRN
  Administered 2015-08-08: 4 mg via INTRAVENOUS
  Filled 2015-08-07: qty 2

## 2015-08-07 MED ORDER — CEFAZOLIN SODIUM-DEXTROSE 2-3 GM-% IV SOLR
2.0000 g | INTRAVENOUS | Status: AC
  Administered 2015-08-07: 2 g via INTRAVENOUS

## 2015-08-07 SURGICAL SUPPLY — 7 items
CABLE SURGICAL S-101-97-12 (CABLE) ×2 IMPLANT
LEAD SOLIA S PRO MRI 45 (Lead) ×2 IMPLANT
LEAD SOLIA S PRO MRI 53 (Lead) ×2 IMPLANT
PAD DEFIB LIFELINK (PAD) ×2 IMPLANT
PPM ELUNA DR-T 394969 (Pacemaker) ×2 IMPLANT
SHEATH CLASSIC 7F (SHEATH) ×4 IMPLANT
TRAY PACEMAKER INSERTION (PACKS) ×2 IMPLANT

## 2015-08-07 NOTE — [place] Notes (Signed)
RN spoke with Dr. Graciela Husbands and made MD aware of hematoma to right groin and that compared to last nights assessment inner right thigh feels firmer and that patient continues to have a lot of pain to the right groin and thigh area. MD acknowledged.

## 2015-08-07 NOTE — H&P (View-Only) (Signed)
HPI Destiny Lutz is a 70 y.o. female presented to the hospital with a history of chest discomfort on pain not related to exertion. Because of her asystolic episode, she underwent catheterization yesterday demonstrating only single-vessel disease in a small diagonal.  Yesterday while talking with her sister-in-law and daughter, records not available, she became syncopal. Telemetry at that time demonstrated sinus node slowing as well as concomitant complete heart block. She had been nauseated all day. It is not clear whether she was vomiting at this time.  She also has an antecedent history of syncope. She sat on the sofa about 3 weeks ago and then awakened to find herself having been incontinent and wet the sofa  She has no significant exertional limitations. She does not have edema nocturnal dyspnea or orthopnea.  She has had a history of palpitations which she describes as irregular.  She notes that she's had transient left facial numbness. She's had no other transient neurological events.  Thromboembolic risk factors are normal for age and gender hypertension and possibly a TIA although she does not have yet known atrial fibrillation Past Medical History  Diagnosis Date  . Hypertension      Surgical History:  Past Surgical History  Procedure Laterality Date  . Hernia repair    . Abdominal hysterectomy    . Rotator cuff repair Right      Home Meds: Prior to Admission medications   Medication Sig Start Date End Date Taking? Authorizing Provider  aspirin EC 81 MG tablet Take 81 mg by mouth daily.   Yes Historical Provider, MD  pantoprazole (PROTONIX) 40 MG tablet Take 40 mg by mouth daily. 07/18/15  Yes Historical Provider, MD  verapamil (CALAN-SR) 240 MG CR tablet Take 240 mg by mouth daily. 07/19/15  Yes Historical Provider, MD  zolpidem (AMBIEN) 10 MG tablet Take 10 mg by mouth at bedtime. 07/03/15  Yes Historical Provider, MD     Inpatient Medications:  . aspirin 81 mg Oral Pre-Cath  . aspirin EC 81 mg Oral Daily  . pantoprazole 40 mg Oral Daily  . sodium chloride flush 3 mL Intravenous Q12H  . sodium chloride flush 3 mL Intravenous Q12H  . zolpidem 10 mg Oral QHS     Allergies: No Known Allergies  Social History   Social History  . Marital Status: Married    Spouse Name: N/A  . Number of Children: N/A  . Years of Education: N/A   Occupational History  . Not on file.   Social History Main Topics  . Smoking status: Never Smoker   . Smokeless tobacco: Not on file  . Alcohol Use: No  . Drug Use: Not on file  . Sexual Activity: Not on file   Other Topics Concern  . Not on file   Social History Narrative  . No narrative on file     Family History  Problem Relation Age of Onset  . CAD Mother   . Hypertension Mother   . Diabetes Mother   . Lung cancer Father      ROS: Please see the history of present illness. All other systems reviewed and negative.    Physical Exam:  Blood pressure 128/77, pulse 61, temperature 98.3 F (36.8 C), temperature source Oral, resp. rate 19, height 5\' 5"  (1.651 m), weight 163 lb 2.3 oz (74 kg), SpO2 98 %. General: Well developed, well nourished female in no acute distress. Head: Normocephalic, atraumatic, sclera non-icteric, no xanthomas, nares are without discharge. EENT: normal Lymph Nodes:  none Back: without scoliosis/kyphosis , no CVA tendersness Neck: Negative for carotid bruits. JVD not elevated. Lungs: Clear bilaterally to auscultation without wheezes, rales, or rhonchi. Breathing is unlabored. Heart: RRR with S1 S2 2/6 systolic murmur , rubs, or gallops appreciated. Abdomen: Soft, non-tender, non-distended with normoactive bowel sounds. No hepatomegaly. No rebound/guarding. No obvious abdominal masses. Msk: Strength and tone  appear normal for age. Extremities: R femoral hematoma No clubbing or cyanosis. No edema. Distal pedal pulses are 2+ and equal bilaterally. Skin: Warm and Dry Neuro: Alert and oriented X 3. CN III-XII intact Grossly normal sensory and motor function . Psych: Responds to questions appropriately with a normal affect.     Labs: Cardiac Enzymes  Recent Labs (last 2 labs)      Recent Labs  08/06/15 0928 08/06/15 1306 08/06/15 1527 08/06/15 2146  TROPONINI 0.03 0.03 <0.03 0.03     CBC  Recent Labs    Lab Results  Component Value Date   WBC 6.7 08/06/2015   HGB 13.9 08/06/2015   HCT 39.9 08/06/2015   MCV 90.1 08/06/2015   PLT 220 08/06/2015     PROTIME:  Recent Labs (last 2 labs)     No results for input(s): LABPROT, INR in the last 72 hours.    Last Labs     Chemistry  Recent Labs Lab 08/06/15 0928  NA 138  K 3.8  CL 104  CO2 26  BUN 16  CREATININE 0.60  CALCIUM 9.2  GLUCOSE 128*     Lipids  Recent Labs    No results found for: CHOL, HDL, LDLCALC, TRIG   BNP  Last Labs    No results found for: PROBNP   Thyroid Function Tests:  Recent Labs (last 2 labs)      Recent Labs  08/06/15 0928 08/06/15 1306  TSH 4.672* --   T3FREE --  3.1       Miscellaneous  Recent Labs    No results found for: DDIMER    Radiology/Studies:   Imaging Results    Dg Chest 2 View  08/06/2015 CLINICAL DATA: Chest pain EXAM: CHEST 2 VIEW COMPARISON: None. FINDINGS: Heart size and vascularity normal. Lungs are clear without infiltrate or effusion. No mass lesion. Mild to moderate hiatal hernia. IMPRESSION: No active cardiopulmonary disease. Electronically Signed By: Marlan Palau M.D. On: 08/06/2015 09:49   US Abdomen Limited Ruq  08/06/2015 CLINICAL DATA: Abdominal pain for 1 day with nausea. Upper abdominal hernia repair 1 year ago. EXAM: US ABDOMEN LIMITED - RIGHT UPPER QUADRANT  COMPARISON: None. FINDINGS: Gallbladder: No gallstones seen. There is no gallbladder wall thickening, pericholecystic fluid or other secondary sonographic signs of acute cholecystitis. No sonographic Murphy's sign elicited during the examination, per the sonographer. Common bile duct: Diameter: Normal at 4 mm. Liver: No focal lesion identified. Within normal limits in parenchymal echogenicity. IMPRESSION: Normal right upper quadrant ultrasound. Electronically Signed By: Bary Richard M.D. 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 It apparently was rather abrupt in onset. Its duration is not known. She awakened on the sofa incontinent. Yesterday's episode of syncope was associated with nausea. There is concomitant AV block as well as sinus arrest suggesting hyper vagotonia and a secondary mechanism. Her presentation with chest pain shortness of breath with radiation  to the arm accompanied by nausea was concerning for ischemia and not withstanding her negative troponins, is very helpful she has been cathed.  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 I  With her palpitations and transient facial numbness I am concerned about atrial fibrillation. Her device will allow [place] to monitor that. We will also anticipate MRI scanning as an outpatient   She had also has unexplained weight loss over the last year. She ascribes it to her father's death. I wonder though with it may not be related to the ongoing problems nausea. I would recommend an outpatient GI evaluation when she returns to [facility]     EP Attending  Patient seen and examined. Discussed the  indications/risks/benefits/goals/expectations of DDD PM insertion for sinus node dysfunction and she wishes to proceed.  Leonia Reeves.D.

## 2015-08-07 NOTE — H&P (Signed)
HPI Destiny Lutz is a 69 y.o. female presented to the hospital with a history of chest discomfort on pain not related to exertion. Because of her asystolic episode, she underwent catheterization yesterday demonstrating only single-vessel disease in a small diagonal.  Yesterday while talking with her sister-in-law and daughter, records not available, she became syncopal. Telemetry at that time demonstrated sinus node slowing as well as concomitant complete heart block. She had been nauseated all day. It is not clear whether she was vomiting at this time.  She also has an antecedent history of syncope. She sat on the sofa about 3 weeks ago and then awakened to find herself having been incontinent and wet the sofa  She has no significant exertional limitations. She does not have edema nocturnal dyspnea or orthopnea.  She has had a history of palpitations which she describes as irregular.  She notes that she's had transient left facial numbness. She's had no other transient neurological events.  Thromboembolic risk factors are normal for age and gender hypertension and possibly a TIA although she does not have yet known atrial fibrillation Past Medical History  Diagnosis Date  . Hypertension      Surgical History:  Past Surgical History  Procedure Laterality Date  . Hernia repair    . Abdominal hysterectomy    . Rotator cuff repair Right      Home Meds: Prior to Admission medications   Medication Sig Start Date End Date Taking? Authorizing Provider  aspirin EC 81 MG tablet Take 81 mg by mouth daily.   Yes Historical Provider, MD  pantoprazole (PROTONIX) 40 MG tablet Take 40 mg by mouth daily. 07/18/15  Yes Historical Provider, MD  verapamil (CALAN-SR) 240 MG CR tablet Take 240 mg by mouth daily. 07/19/15  Yes Historical Provider, MD  zolpidem (AMBIEN) 10 MG tablet Take 10 mg by mouth at bedtime. 07/03/15  Yes Historical Provider, MD     Inpatient Medications:  . aspirin 81 mg Oral Pre-Cath  . aspirin EC 81 mg Oral Daily  . pantoprazole 40 mg Oral Daily  . sodium chloride flush 3 mL Intravenous Q12H  . sodium chloride flush 3 mL Intravenous Q12H  . zolpidem 10 mg Oral QHS     Allergies: No Known Allergies  Social History   Social History  . Marital Status: Married    Spouse Name: N/A  . Number of Children: N/A  . Years of Education: N/A   Occupational History  . Not on file.   Social History Main Topics  . Smoking status: Never Smoker   . Smokeless tobacco: Not on file  . Alcohol Use: No  . Drug Use: Not on file  . Sexual Activity: Not on file   Other Topics Concern  . Not on file   Social History Narrative  . No narrative on file     Family History  Problem Relation Age of Onset  . CAD Mother   . Hypertension Mother   . Diabetes Mother   . Lung cancer Father      ROS: Please see the history of present illness. All other systems reviewed and negative.    Physical Exam:  Blood pressure 128/77, pulse 61, temperature 98.3 F (36.8 C), temperature source Oral, resp. rate 19, height 5' 5" (1.651 m), weight 163 lb 2.3 oz (74 kg), SpO2 98 %. General: Well developed, well nourished female in no acute distress. Head: Normocephalic, atraumatic, sclera non-icteric, no xanthomas, nares are without discharge. EENT: normal Lymph Nodes:   none Back: without scoliosis/kyphosis , no CVA tendersness Neck: Negative for carotid bruits. JVD not elevated. Lungs: Clear bilaterally to auscultation without wheezes, rales, or rhonchi. Breathing is unlabored. Heart: RRR with S1 S2 2/6 systolic murmur , rubs, or gallops appreciated. Abdomen: Soft, non-tender, non-distended with normoactive bowel sounds. No hepatomegaly. No rebound/guarding. No obvious abdominal masses. Msk: Strength and tone  appear normal for age. Extremities: R femoral hematoma No clubbing or cyanosis. No edema. Distal pedal pulses are 2+ and equal bilaterally. Skin: Warm and Dry Neuro: Alert and oriented X 3. CN III-XII intact Grossly normal sensory and motor function . Psych: Responds to questions appropriately with a normal affect.     Labs: Cardiac Enzymes  Recent Labs (last 2 labs)      Recent Labs  08/06/15 0928 08/06/15 1306 08/06/15 1527 08/06/15 2146  TROPONINI 0.03 0.03 <0.03 0.03     CBC  Recent Labs    Lab Results  Component Value Date   WBC 6.7 08/06/2015   HGB 13.9 08/06/2015   HCT 39.9 08/06/2015   MCV 90.1 08/06/2015   PLT 220 08/06/2015     PROTIME:  Recent Labs (last 2 labs)     No results for input(s): LABPROT, INR in the last 72 hours.    Last Labs     Chemistry  Recent Labs Lab 08/06/15 0928  NA 138  K 3.8  CL 104  CO2 26  BUN 16  CREATININE 0.60  CALCIUM 9.2  GLUCOSE 128*     Lipids  Recent Labs    No results found for: CHOL, HDL, LDLCALC, TRIG   BNP  Last Labs    No results found for: PROBNP   Thyroid Function Tests:  Recent Labs (last 2 labs)      Recent Labs  08/06/15 0928 08/06/15 1306  TSH 4.672* --   T3FREE --  3.1       Miscellaneous  Recent Labs    No results found for: DDIMER    Radiology/Studies:   Imaging Results    Dg Chest 2 View  08/06/2015 CLINICAL DATA: Chest pain EXAM: CHEST 2 VIEW COMPARISON: None. FINDINGS: Heart size and vascularity normal. Lungs are clear without infiltrate or effusion. No mass lesion. Mild to moderate hiatal hernia. IMPRESSION: No active cardiopulmonary disease. Electronically Signed By: Charles Clark M.D. On: 08/06/2015 09:49   Us Abdomen Limited Ruq  08/06/2015 CLINICAL DATA: Abdominal pain for 1 day with nausea. Upper abdominal hernia repair 1 year ago. EXAM: US ABDOMEN LIMITED - RIGHT UPPER QUADRANT  COMPARISON: None. FINDINGS: Gallbladder: No gallstones seen. There is no gallbladder wall thickening, pericholecystic fluid or other secondary sonographic signs of acute cholecystitis. No sonographic Murphy's sign elicited during the examination, per the sonographer. Common bile duct: Diameter: Normal at 4 mm. Liver: No focal lesion identified. Within normal limits in parenchymal echogenicity. IMPRESSION: Normal right upper quadrant ultrasound. Electronically Signed By: Stan Maynard M.D. 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 It apparently was rather abrupt in onset. Its duration is not known. She awakened on the sofa incontinent. Yesterday's episode of syncope was associated with nausea. There is concomitant AV block as well as sinus arrest suggesting hyper vagotonia and a secondary mechanism. Her presentation with chest pain shortness of breath with radiation   to the arm accompanied by nausea was concerning for ischemia and not withstanding her negative troponins, is very helpful she has been cathed.  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 I  With her palpitations and transient facial numbness I am concerned about atrial fibrillation. Her device will allow us to monitor that. We will also anticipate MRI scanning as an outpatient   She had also has unexplained weight loss over the last year. She ascribes it to her father's death. I wonder though with it may not be related to the ongoing problems nausea. I would recommend an outpatient GI evaluation when she returns to [place]     EP Attending  Patient seen and examined. Discussed the  indications/risks/benefits/goals/expectations of DDD PM insertion for sinus node dysfunction and she wishes to proceed.  Gregg Taylor,M.D. 

## 2015-08-07 NOTE — Interval H&P Note (Signed)
History and Physical Interval Note:  08/07/2015 4:09 PM  Destiny Lutz  has presented today for surgery, with the diagnosis of hb  The various methods of treatment have been discussed with the patient and family. After consideration of risks, benefits and other options for treatment, the patient has consented to  Procedure(s): Pacemaker Implant (N/A) as a surgical intervention .  The patient's history has been reviewed, patient examined, no change in status, stable for surgery.  I have reviewed the patient's chart and labs.  Questions were answered to the patient's satisfaction.     Lewayne Bunting

## 2015-08-07 NOTE — Discharge Summary (Signed)
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 Hypertension     HOSPITAL COURSE:  70 y.o. female with a known history of HTN and prior syncope several years ago admitted to [facility] for substernal chest pain radiating to the bilateral axilla. Some associated shortness of breath along with nausea. Please see Dr Margaretmary Eddy dictated history and physical for further details.  Cardiology consultation was obtained with Dr Kirke Corin.  Who recommended echocardiogram which was obtained showing normal LV systolic function.  EKG showed first-degree AV block and right bundle branch block.  Troponin remained negative.  While on telemetry.  She had complete heart block with 17 second pause associated with loss of consciousness.  She also had a similar episode in the emergency room with severe bradycardia in 30s and complete heart block which responded to sternal rub.  Considering her dose episode, she underwent cardiac catheterization urgently per recommendation from cardiology, which did not show any significant lesion which were stentable.  Considering her symptomatic high degree AV block.  Electrophysiology consult was obtained with Dr. Graciela Husbands who recommended transfer to [facility] for possible permanent pacemaker where she is being  transferred.  Please note patient has a right groin hematoma post cardiac catheterization.  She also has a temporary pacer wire sitting there.  She is also a lot pain in the same area requiring pain medication.  She and her husband are agreeable with transfer to [facility] where she is being transferred in stable condition.  Case discussed with Dr. Graciela Husbands and nurse. DISCHARGE CONDITIONS:   stable  CONSULTS OBTAINED:  Treatment Team:  Iran Ouch, MD  DRUG ALLERGIES:  No Known Allergies  DISCHARGE MEDICATIONS:   Current Discharge Medication List    CONTINUE these medications which have NOT CHANGED   Details  aspirin EC 81 MG tablet Take 81 mg by mouth daily.    pantoprazole (PROTONIX) 40 MG tablet Take 40 mg by mouth daily. Refills: 5    verapamil (CALAN-SR) 240 MG CR tablet Take 240 mg by mouth daily. Refills: 4    zolpidem (AMBIEN) 10 MG tablet Take 10 mg by mouth at bedtime. Refills: 0         DISCHARGE INSTRUCTIONS:    DIET:  Cardiac diet  DISCHARGE CONDITION:  Good  ACTIVITY:  Activity as tolerated  OXYGEN:  Home Oxygen: No.   Oxygen Delivery: room air  DISCHARGE LOCATION:  [facility]   If you experience worsening of your admission symptoms, develop shortness of breath, life threatening emergency, suicidal or homicidal thoughts you must seek medical attention immediately by calling 911 or calling your MD immediately  if symptoms less severe.  You Must read complete instructions/literature along with all the possible adverse reactions/side effects for all the Medicines you take and that have been prescribed to you. Take any new  Medicines after you have completely understood and accpet all the possible adverse reactions/side effects.   Please note  You were cared for by a hospitalist during your hospital stay. If you have any questions about your discharge medications or the care you received while you were in the hospital after  you are discharged, you can call the unit and asked to speak with the hospitalist on call if the hospitalist that took care of you is not available. Once you are discharged, your primary care physician will handle any further medical issues. Please note that NO REFILLS for any discharge medications will be authorized once you are discharged, as it is imperative that you return to your primary care physician (or establish a relationship with a primary care physician if you do not have one) for your aftercare needs so that they can reassess your need for medications and monitor your lab values.    On the day of Discharge:  VITAL SIGNS:  Blood pressure 148/96, pulse 71, temperature 98.3 F (36.8 C), temperature source Oral, resp. rate 15, height  (1.651 m), weight 74 kg (163 lb 2.3 oz), SpO2 97 %.  PHYSICAL EXAMINATION:  GENERAL:  70 y.o.-year-old patient lying in the bed with no acute distress.  EYES: Pupils equal, round, reactive to light and accommodation. No scleral icterus. Extraocular muscles intact.  HEENT: Head atraumatic, normocephalic. Oropharynx and nasopharynx clear.  NECK:  Supple, no jugular venous distention. No thyroid enlargement, no tenderness.  LUNGS: Normal breath sounds bilaterally, no wheezing, rales,rhonchi or crepitation. No use of accessory muscles of respiration.  CARDIOVASCULAR: S1, S2 normal. No murmurs, rubs, or gallops.  ABDOMEN: Soft, non-tender, non-distended. Bowel sounds present. No organomegaly or mass.  EXTREMITIES: No pedal edema, cyanosis, or clubbing.  NEUROLOGIC: Cranial nerves II through XII are intact. Muscle strength 5/5 in all extremities. Sensation intact. Gait not checked.  PSYCHIATRIC: The patient is alert and oriented x 3.  SKIN: No obvious rash, lesion, or ulcer.  Rt Groin: hematoma to right groin - inner right thigh feels firmer and that patient continues to have a lot of pain to the right groin and thigh area. DATA REVIEW:   CBC  Recent  Labs Lab 08/06/15 0928  WBC 6.7  HGB 13.9  HCT 39.9  PLT 220    Chemistries   Recent Labs Lab 08/06/15 0928  NA 138  K 3.8  CL 104  CO2 26  GLUCOSE 128*  BUN 16  CREATININE 0.60  CALCIUM 9.2  MG 1.9    Cardiac Enzymes  Recent Labs Lab 08/06/15 2146  TROPONINI 0.03    Microbiology Results  No results found for this or any previous visit.  RADIOLOGY:  Dg Chest 2 View  08/06/2015  CLINICAL DATA:  Chest pain EXAM: CHEST  2 VIEW COMPARISON:  None. FINDINGS: Heart size and vascularity normal. Lungs are clear without infiltrate or effusion. No mass lesion. Mild to moderate hiatal hernia. IMPRESSION: No active cardiopulmonary disease. Electronically Signed   By: Marlan Palau M.D.   On: 08/06/2015 09:49   US Abdomen Limited Ruq  08/06/2015  CLINICAL DATA:  Abdominal pain for 1 day with nausea. Upper abdominal hernia repair 1 year ago. EXAM: US ABDOMEN LIMITED - RIGHT UPPER QUADRANT COMPARISON:  None. FINDINGS: Gallbladder: No gallstones seen. There is no gallbladder wall thickening, pericholecystic fluid or other secondary sonographic signs of acute cholecystitis. No sonographic Murphy's sign elicited during the examination, per the sonographer. Common bile duct: Diameter: Normal at 4 mm.  Liver: No focal lesion identified. Within normal limits in parenchymal echogenicity. IMPRESSION: Normal right upper quadrant ultrasound. Electronically Signed   By: Bary Richard M.D.   On: 08/06/2015 14:01     Management plans discussed with the patient, family and they are in agreement.  CODE STATUS:     Code Status Orders        Start     Ordered   08/06/15 1231  Full code   Continuous     08/06/15 1230    Code Status History    Date Active Date Inactive Code Status Order ID Comments User Context   This patient has a current code status but no historical code status.    Advance Directive Documentation        Most Recent Value   Type of Advance Directive  Healthcare Power of  Attorney   Pre-existing out of facility DNR order (yellow form or pink MOST form)     "MOST" Form in Place?        TOTAL TIME TAKING CARE OF THIS PATIENT: 55 minutes.    [facility], Jorrell Kuster M.D on 08/07/2015 at 9:11 AM  Between 7am to 6pm - Pager - [phone]  After 6pm go to [email or web address] - password [email or web address] [facility]  [facility]  Office  [phone]  CC: Primary care physician; No PCP Per Patient Lorine Bears MD, [facility] Duke Salvia, MD  Note: This dictation was prepared with Dragon dictation along with smaller phrase technology. Any transcriptional errors that result from this process are unintentional.

## 2015-08-07 NOTE — Progress Notes (Signed)
Received to [facility] from [facility]. Husband and son here. Consent signed. Dr. Ladona Ridgel in room talking w/patient. Temporary Pacing venous line rt groin connected to temp box, rate 45, V MA 10. Sensing. Arrived to [facility] w venous sheath rt groin w/3-way stop cock turned off to pt., ends capped. Large hematoma rt groin extending medial where it is more firm. Dressing intact.

## 2015-08-07 NOTE — Progress Notes (Signed)
Report given via telephone to [employer]. From [facility].

## 2015-08-07 NOTE — Consult Note (Addendum)
ELECTROPHYSIOLOGY CONSULT NOTE  Patient ID: Destiny Lutz, MRN: [id number], DOB/AGE: 1946/05/03 70 y.o. Admit date: 08/06/2015 Date of Consult: 08/07/2015  Primary Physician: No PCP Per Patient Primary Cardiologist: new  Chief Complaint: syncope   HPI Destiny Lutz is a 70 y.o. female presented to the hospital with a history of chest discomfort on pain not related to exertion. Because of her asystolic episode, she underwent catheterization yesterday demonstrating only single-vessel disease in a small diagonal.  Yesterday while talking with her sister-in-law and daughter, records not available, she became syncopal. Telemetry at that time demonstrated sinus node slowing as well as concomitant complete heart block. She had been nauseated all day. It is not clear whether she was vomiting at this time.  She also has an antecedent history of syncope. She sat on the sofa about 3 weeks ago and then awakened to find herself having been incontinent and wet the sofa  She has no significant exertional limitations. She does not have edema nocturnal dyspnea or orthopnea.  She has had a history of palpitations which she describes as irregular.  She notes that she's had transient left facial numbness. She's had no other transient neurological events.  Thromboembolic risk factors are normal for age and gender hypertension and possibly a TIA although she does not have yet known atrial fibrillation Past Medical History  Diagnosis Date  . Hypertension       Surgical History:  Past Surgical History  Procedure Laterality Date  . Hernia repair    . Abdominal hysterectomy    . Rotator cuff repair Right      Home Meds: Prior to Admission medications   Medication Sig Start Date End Date Taking? Authorizing Provider  aspirin EC 81 MG tablet Take 81 mg by mouth daily.   Yes Historical Provider, MD  pantoprazole (PROTONIX) 40 MG tablet Take 40 mg by mouth daily. 07/18/15  Yes Historical Provider, MD    verapamil (CALAN-SR) 240 MG CR tablet Take 240 mg by mouth daily. 07/19/15  Yes Historical Provider, MD  zolpidem (AMBIEN) 10 MG tablet Take 10 mg by mouth at bedtime. 07/03/15  Yes Historical Provider, MD    Inpatient Medications:  . aspirin  81 mg Oral Pre-Cath  . aspirin EC  81 mg Oral Daily  . pantoprazole  40 mg Oral Daily  . sodium chloride flush  3 mL Intravenous Q12H  . sodium chloride flush  3 mL Intravenous Q12H  . zolpidem  10 mg Oral QHS     Allergies: No Known Allergies  Social History   Social History  . Marital Status: Married    Spouse Name: N/A  . Number of Children: N/A  . Years of Education: N/A   Occupational History  . Not on file.   Social History Main Topics  . Smoking status: Never Smoker   . Smokeless tobacco: Not on file  . Alcohol Use: No  . Drug Use: Not on file  . Sexual Activity: Not on file   Other Topics Concern  . Not on file   Social History Narrative  . No narrative on file     Family History  Problem Relation Age of Onset  . CAD Mother   . Hypertension Mother   . Diabetes Mother   . Lung cancer Father      ROS:  Please see the history of present illness.     All other systems reviewed and negative.    Physical Exam:   Blood pressure 128/77,  pulse 61, temperature 98.3 F (36.8 C), temperature source Oral, resp. rate 19, height 5\' 5"  (1.651 m), weight 163 lb 2.3 oz (74 kg), SpO2 98 %. General: Well developed, well nourished female in no acute distress. Head: Normocephalic, atraumatic, sclera non-icteric, no xanthomas, nares are without discharge. EENT: normal Lymph Nodes:  none Back: without scoliosis/kyphosis , no CVA tendersness Neck: Negative for carotid bruits. JVD not elevated. Lungs: Clear bilaterally to auscultation without wheezes, rales, or rhonchi. Breathing is unlabored. Heart: RRR with S1 S2  2/6 systolic  murmur , rubs, or gallops appreciated. Abdomen: Soft, non-tender, non-distended with normoactive bowel  sounds. No hepatomegaly. No rebound/guarding. No obvious abdominal masses. Msk:  Strength and tone appear normal for age. Extremities: R femoral hematoma No clubbing or cyanosis. No  edema.  Distal pedal pulses are 2+ and equal bilaterally. Skin: Warm and Dry Neuro: Alert and oriented X 3. CN III-XII intact Grossly normal sensory and motor function . Psych:  Responds to questions appropriately with a normal affect.      Labs: Cardiac Enzymes  Recent Labs  08/06/15 0928 08/06/15 1306 08/06/15 1527 08/06/15 2146  TROPONINI 0.03 0.03 <0.03 0.03   CBC Lab Results  Component Value Date   WBC 6.7 08/06/2015   HGB 13.9 08/06/2015   HCT 39.9 08/06/2015   MCV 90.1 08/06/2015   PLT 220 08/06/2015   PROTIME: No results for input(s): LABPROT, INR in the last 72 hours. Chemistry  Recent Labs Lab 08/06/15 0928  NA 138  K 3.8  CL 104  CO2 26  BUN 16  CREATININE 0.60  CALCIUM 9.2  GLUCOSE 128*   Lipids No results found for: CHOL, HDL, LDLCALC, TRIG BNP No results found for: PROBNP Thyroid Function Tests:  Recent Labs  08/06/15 0928 08/06/15 1306  TSH 4.672*  --   T3FREE  --  3.1      Miscellaneous No results found for: DDIMER  Radiology/Studies:  Dg Chest 2 View  08/06/2015  CLINICAL DATA:  Chest pain EXAM: CHEST  2 VIEW COMPARISON:  None. FINDINGS: Heart size and vascularity normal. Lungs are clear without infiltrate or effusion. No mass lesion. Mild to moderate hiatal hernia. IMPRESSION: No active cardiopulmonary disease. Electronically Signed   By: Marlan Palau M.D.   On: 08/06/2015 09:49   US Abdomen Limited Ruq  08/06/2015  CLINICAL DATA:  Abdominal pain for 1 day with nausea. Upper abdominal hernia repair 1 year ago. EXAM: US ABDOMEN LIMITED - RIGHT UPPER QUADRANT COMPARISON:  None. FINDINGS: Gallbladder: No gallstones seen. There is no gallbladder wall thickening, pericholecystic fluid or other secondary sonographic signs of acute cholecystitis. No  sonographic Murphy's sign elicited during the examination, per the sonographer. Common bile duct: Diameter: Normal at 4 mm. Liver: No focal lesion identified. Within normal limits in parenchymal echogenicity. IMPRESSION: Normal right upper quadrant ultrasound. Electronically Signed   By: Bary Richard M.D.   On: 08/06/2015 14:01    EKG: sinus 67 16/15/42 RBBB   Assessment and Plan:  RBBB  Sinus arrest simultaneous with Complete Heart Block  Irregular palpitations  Transient facial numbness   She had an episode of syncope about 3 weeks ago. It apparently was rather abrupt in onset. Its duration is not known. She awakened on the sofa incontinent. Yesterday's episode of syncope was associated with nausea. There is concomitant AV block as well as sinus arrest suggesting hyper vagotonia and a secondary mechanism. Her presentation with chest pain shortness of breath with radiation to the arm  accompanied by nausea was concerning for ischemia and not withstanding her negative troponins, is very helpful she has been cathed.  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 I  With her palpitations and transient facial numbness I am concerned about atrial fibrillation. Her device will allow [place] to monitor that. We will also anticipate MRI scanning as an outpatient   She had also has unexplained weight loss over the last year. She ascribes it to her father's death. I wonder though with it may not be related to the ongoing problems nausea. I would recommend an outpatient GI evaluation when she returns to [facility].     Sherryl Manges

## 2015-08-07 NOTE — Discharge Instructions (Signed)

## 2015-08-07 NOTE — Progress Notes (Signed)
Patient A&Ox4, denies pain.  NSR with BBB.  Patient moved from bed to stretcher by RN and [facility] staff. Patient left ICU by stretcher with [facility] staff and daughter at side.

## 2015-08-07 NOTE — Progress Notes (Signed)
Report called to Dorothyann Gibbs, RN at [facility] cath lab.

## 2015-08-07 NOTE — Progress Notes (Signed)
Site area: rt groin fv sheath Site Prior to Removal:  Level  2 w/small area of bruising at puncture site Pressure Applied For: 20 minutes Manual:   yes Patient Status During Pull:  stable Post Pull Site:  Level  2; hematoma not as firm Post Pull Instructions Given:  yes Post Pull Pulses Present: yes Dressing Applied:  Small tegaderm Bedrest begins @  1810 Comments:  IV locked

## 2015-08-08 ENCOUNTER — Inpatient Hospital Stay (HOSPITAL_COMMUNITY): Payer: Medicare Other

## 2015-08-08 ENCOUNTER — Encounter (HOSPITAL_COMMUNITY): Payer: Self-pay | Admitting: Internal Medicine

## 2015-08-08 DIAGNOSIS — Z90710 Acquired absence of both cervix and uterus: Secondary | ICD-10-CM | POA: Diagnosis not present

## 2015-08-08 DIAGNOSIS — R200 Anesthesia of skin: Secondary | ICD-10-CM

## 2015-08-08 DIAGNOSIS — R634 Abnormal weight loss: Secondary | ICD-10-CM

## 2015-08-08 DIAGNOSIS — R002 Palpitations: Secondary | ICD-10-CM

## 2015-08-08 DIAGNOSIS — R079 Chest pain, unspecified: Secondary | ICD-10-CM | POA: Diagnosis present

## 2015-08-08 DIAGNOSIS — Z79899 Other long term (current) drug therapy: Secondary | ICD-10-CM | POA: Diagnosis not present

## 2015-08-08 DIAGNOSIS — Z8249 Family history of ischemic heart disease and other diseases of the circulatory system: Secondary | ICD-10-CM | POA: Diagnosis not present

## 2015-08-08 DIAGNOSIS — R208 Other disturbances of skin sensation: Secondary | ICD-10-CM

## 2015-08-08 DIAGNOSIS — I10 Essential (primary) hypertension: Secondary | ICD-10-CM | POA: Diagnosis present

## 2015-08-08 DIAGNOSIS — Z7982 Long term (current) use of aspirin: Secondary | ICD-10-CM | POA: Diagnosis not present

## 2015-08-08 DIAGNOSIS — I442 Atrioventricular block, complete: Secondary | ICD-10-CM | POA: Diagnosis present

## 2015-08-08 LAB — CBC
HCT: 33.6 % — ABNORMAL LOW (ref 36.0–46.0)
HEMOGLOBIN: 10.8 g/dL — AB (ref 12.0–15.0)
MCH: 30 pg (ref 26.0–34.0)
MCHC: 32.1 g/dL (ref 30.0–36.0)
MCV: 93.3 fL (ref 78.0–100.0)
Platelets: 194 10*3/uL (ref 150–400)
RBC: 3.6 MIL/uL — ABNORMAL LOW (ref 3.87–5.11)
RDW: 13.6 % (ref 11.5–15.5)
WBC: 12.2 10*3/uL — ABNORMAL HIGH (ref 4.0–10.5)

## 2015-08-08 MED ORDER — CETYLPYRIDINIUM CHLORIDE 0.05 % MT LIQD: 7.0000 mL | Freq: Two times a day (BID) | OROMUCOSAL | Status: DC

## 2015-08-08 MED ORDER — ONDANSETRON HCL 4 MG PO TABS
4.0000 mg | ORAL_TABLET | Freq: Once | ORAL | Status: AC
  Administered 2015-08-08: 4 mg via ORAL
  Filled 2015-08-08: qty 1

## 2015-08-08 NOTE — Care Management Note (Signed)
Case Management Note  Patient Details  Name: Sayler Mickiewicz MRN: [id number] Date of Birth: 02-01-1946  Subjective/Objective:  Patient is from home with spouse, son, Gerda Diss is good support system for patient.  She is on coumadin and she is for dc today to home.  NCM spoke with Vernona Rieger RN regarding pt eval for patient , at this time she did not feel patient needs pt eval.                  Action/Plan:   Expected Discharge Date:                  Expected Discharge Plan:  Home/Self Care  In-House Referral:     Discharge planning Services  CM Consult  Post Acute Care Choice:    Choice offered to:     DME Arranged:    DME Agency:     HH Arranged:    HH Agency:     Status of Service:  Completed, signed off  Medicare Important Message Given:    Date Medicare IM Given:    Medicare IM give by:    Date Additional Medicare IM Given:    Additional Medicare Important Message give by:     If discussed at Long Length of Stay Meetings, dates discussed:    Additional Comments:  Leone Haven, RN 08/08/2015, 11:40 AM

## 2015-08-08 NOTE — Progress Notes (Signed)
       Patient Name: Simrah Bonds      SUBJECTIVE: EVENTS of last night noted    Past Medical History  Diagnosis Date  . Hypertension     Scheduled Meds:  Scheduled Meds: . antiseptic oral rinse  7 mL Mouth Rinse BID  . pantoprazole  40 mg Oral Daily  . verapamil  240 mg Oral Daily  . zolpidem  5 mg Oral QHS   Continuous Infusions:  acetaminophen, ondansetron (ZOFRAN) IV    PHYSICAL EXAM Filed Vitals:   08/08/15 0430 08/08/15 0500 08/08/15 0600 08/08/15 0837  BP: 120/53 119/58 116/56 105/62  Pulse:    76  Temp:    97.7 F (36.5 C)  TempSrc:    Oral  Resp: Weight:      SpO2:    95%    Well developed and nourished in no acute distress HENT normal Neck supple with JVP-flat Clear Regular rate and rhythm, no murmurs or gallops Abd-soft with active BS Small and painful hematoma No Clubbing cyanosis edema Skin-warm and dry A & Oriented  Grossly normal sensory and motor function   TELEMETRY: Reviewed telemetry pt in nsr with some atrial and ventricular pacing    Intake/Output Summary (Last 24 hours) at 08/08/15 0939 Last data filed at 08/07/15 2200  Gross per 24 hour  Intake    290 ml  Output      0 ml  Net    290 ml    LABS: Basic Metabolic Panel:  Recent Labs Lab 08/06/15 0928  NA 138  K 3.8  CL 104  CO2 26  GLUCOSE 128*  BUN 16  CREATININE 0.60  CALCIUM 9.2  MG 1.9   Cardiac Enzymes:  Recent Labs  08/06/15 1527 08/06/15 2146 08/07/15 0916  TROPONINI <0.03 0.03 0.03   CBC:  Recent Labs Lab 08/06/15 0928 08/07/15 0916 08/08/15 0420  WBC 6.7 12.6* 12.2*  HGB 13.9 12.0 10.8*  HCT 39.9 35.4 33.6*  MCV 90.1 90.4 93.3  PLT 220 227 194   PROTIME: No results for input(s): LABPROT, INR in the last 72 hours. Liver Function Tests: No results for input(s): AST, ALT, ALKPHOS, BILITOT, PROT, ALBUMIN in the last 72 hours.   Recent Labs  08/06/15 0928 08/06/15 1306  TSH 4.672*  --   T3FREE  --  3.1   Anemia  Panel: No results for input(s): VITAMINB12, FOLATE, FERRITIN, TIBC, IRON, RETICCTPCT in the last 72 hours.   Device Interrogation: normal device function  ASSESSMENT AND PLAN:  Active Problems:   Symptomatic sinus bradycardia   Complete heart block (HCC)   CHB (complete heart block) (HCC)   Facial numbness   Palpitation   Weight loss of more than 10% body weight hematoma   Pt has multitude of issues of which only one is the syncope and documented bradyarrhtyhmia Last nights event unassoc with atrial pacing  Will enhance CLS  She will need followup with her PCP to pursue the above Rec would include MRI after 6 weeks to loof for evidence of stroke Follow device for Atrial high rate episodes CT/Eval of abd for explanation of N/V and weight loss  Spoke with family they would like followup initially here with us\  Instructions given for apcemaker and hematoma   Signed, Sherryl Manges MD  08/08/2015

## 2015-08-08 NOTE — Discharge Summary (Signed)
ELECTROPHYSIOLOGY PROCEDURE DISCHARGE SUMMARY    Patient ID: Destiny Lutz,  MRN: [id number], DOB/AGE: April 12, 1946 70 y.o.  Admit date: 08/07/2015 Discharge date: 08/08/2015  Primary Care Physician: No PCP Per Patient Electrophysiologist: Graciela Husbands  Primary Discharge Diagnosis:  Symptomatic bradycardia status post pacemaker implantation this admission  Secondary Discharge Diagnosis:  1.  Vasovagal syncope 2.  Hypertension 3.  Palpitations  No Known Allergies   Procedures This Admission:  1.  Implantation of a Biotronik dual chamber PPM on 08/07/15 by Dr Ladona Ridgel. See op note for full details. There were no immediate post procedure complications. 2.  CXR on 08/08/15 demonstrated no pneumothorax status post device implantation.   Brief HPI/Hospital Course:  Destiny Lutz is a 70 y.o. female who presented to [facility] with syncope and was found to have P-P prolongation as well as heart block on telemetry.  A temporary pacing wire was placed and she was transferred to [facility] for further evaluation. Cardiac catheterization at [facility] demonstrated normal LV function, no obstructive CAD. The patient was admitted to [facility] and underwent implantation of a Biotronik dual chamber pacemaker with details as outlined above.  She  was monitored on telemetry overnight which demonstrated atrial pacing with intrinsic ventricular conduction.  Left chest was without hematoma or ecchymosis.  The device was interrogated and found to be functioning normally.  CXR was obtained and demonstrated no pneumothorax status post device implantation.  Wound care, arm mobility, and restrictions were reviewed with the patient.  The patient was examined and considered stable for discharge to home.   She needs outpatient neurology follow up for facial numbness. She does have a MRI compatible device, but will need to wait 6 weeks before having MRI.  She has also had weight loss and will need outpatient GI eval with CT scan.   Physical  Exam: Filed Vitals:   08/08/15 0430 08/08/15 0500 08/08/15 0600 08/08/15 0837  BP: 120/53 119/58 116/56 105/62  Pulse:    76  Temp:    97.7 F (36.5 C)  TempSrc:    Oral  Resp: Weight:      SpO2:    95%    Labs:   Lab Results  Component Value Date   WBC 12.2* 08/08/2015   HGB 10.8* 08/08/2015   HCT 33.6* 08/08/2015   MCV 93.3 08/08/2015   PLT 194 08/08/2015     Recent Labs Lab 08/06/15 0928  NA 138  K 3.8  CL 104  CO2 26  BUN 16  CREATININE 0.60  CALCIUM 9.2  GLUCOSE 128*    Discharge Medications:    Medication List    TAKE these medications        aspirin EC 81 MG tablet  Take 81 mg by mouth daily.     pantoprazole 40 MG tablet  Commonly known as:  PROTONIX  Take 40 mg by mouth daily.     verapamil 240 MG CR tablet  Commonly known as:  CALAN-SR  Take 240 mg by mouth daily.     zolpidem 10 MG tablet  Commonly known as:  AMBIEN  Take 10 mg by mouth at bedtime.        Disposition:  Discharge Instructions    Diet - low sodium heart healthy    Complete by:  As directed      Discharge instructions    Complete by:  As directed   Follow up with PCP in 1 week Need to establish with GI MD  as well as neurology as an outpatient.     Increase activity slowly    Complete by:  As directed           Follow-up Information    Follow up with [facility] On 08/20/2015.   Specialty:  Cardiology   Why:  at 4:30PM for wound check    Contact information:   [address] [place] [address] [phone]      Follow up with Sherryl Manges, MD On 11/06/2015.   Specialty:  Cardiology   Why:  at [facility] information:   [address] [place] [address] [phone]       Duration of Discharge Encounter: Greater than 30 minutes including physician time.  Signed, Gypsy Balsam, NP 08/08/2015 11:21 AM

## 2015-08-08 NOTE — Discharge Instructions (Signed)
° ° °  Supplemental Discharge Instructions for  Pacemaker/Defibrillator Patients  Activity No heavy lifting or vigorous activity with your left/right arm for 6 to 8 weeks.  Do not raise your left/right arm above your head for one week.  Gradually raise your affected arm as drawn below.           __         08/11/15                   08/12/15                    08/13/15                 08/14/15  NO DRIVING   WOUND CARE - Keep the wound area clean and dry.  Do not get this area wet for one week. No showers for one week; you may shower on 08/14/15    . - The tape/steri-strips on your wound will fall off; do not pull them off.  No bandage is needed on the site.  DO  NOT apply any creams, oils, or ointments to the wound area. - If you notice any drainage or discharge from the wound, any swelling or bruising at the site, or you develop a fever > 101? F after you are discharged home, call the office at once.  Special Instructions - You are still able to use cellular telephones; use the ear opposite the side where you have your pacemaker/defibrillator.  Avoid carrying your cellular phone near your device. - When traveling through airports, show security personnel your identification card to avoid being screened in the metal detectors.  Ask the security personnel to use the hand wand. - Avoid arc welding equipment, MRI testing (magnetic resonance imaging), TENS units (transcutaneous nerve stimulators).  Call the office for questions about other devices. - Avoid electrical appliances that are in poor condition or are not properly grounded. - Microwave ovens are safe to be near or to operate.

## 2015-08-08 NOTE — Progress Notes (Signed)
This shift at 0215, pt up to bathroom to void with one asst with steady gait and sling intact. Placed on toilet and nt instructed pt to not get up alone while she went  to get a fresh gown for her. NT was 2 rooms away from being back to pt's room when we heard a noise in pt's room. Ran to room and found pt lying on the floor on her back. She was conscious and oriented and able to answer questions. Son was in room and witnessed the fall but could not get to her soon enough before pt went down.  He stated pt sort of leaned backward toward the bathroom door as she walked out of bathroom and he asked her if she was ok and then she just went down but did not hit her head. Upon speaking to pt she also stated she did not hit her head. I felt all around her head and inspected entire body for bleeding or injury but none found. Pacer site unchanged/unremarkable after fall and sling remained intact also with left arm against chest.  Pt's only complaint was with her rt leg at the groin site.  Pt already had a small hematoma with bruising to the rt groin. No changes at this point. Pt's bp while on the floor was 94/58 HR in the 60's.  It appears that she had a few pvc's right near the time she fell and then began pacing afterwards and few minutes later was in sr with 1st degree block. Pt was lifted back to bed with asst x 4. Dr Tresa Endo was paged and texted about this incident and updated. Order received for cbc this am and he is aware there is a pa/lat cxr scheduled for this am as well. Will cont to monitor pt closely.

## 2015-08-10 ENCOUNTER — Telehealth: Payer: Self-pay | Admitting: Internal Medicine

## 2015-08-10 NOTE — Telephone Encounter (Signed)
New message     Pt had a pacemaker put in on 08-07-15.  Can she take something stronger than tylenol?

## 2015-08-10 NOTE — Telephone Encounter (Signed)
Spoke with husband and let him know we generally do not call anything in for pain. She can rotate Tylenol with Ibuprofen every 4-6 hours as needed

## 2015-08-15 ENCOUNTER — Telehealth: Payer: Self-pay | Admitting: *Deleted

## 2015-08-15 DIAGNOSIS — Z950 Presence of cardiac pacemaker: Secondary | ICD-10-CM

## 2015-08-15 NOTE — Telephone Encounter (Signed)
-----   Message from Danton Clap sent at 08/14/2015  2:49 PM EST ----- Elvina Sidle!!  This is the patient that needed to be called regarding her wound check-per cancellation note she states Dr. Leanne Chang at [facility] will be checking it for her, however she is having a problem with her leads and per Kara Mead may still need to come in here.  Thank you!!  Efraim Kaufmann

## 2015-08-17 NOTE — Addendum Note (Signed)
Addended by: Doroteo Glassman C on: 08/17/2015 06:00 PM   Modules accepted: Orders

## 2015-08-17 NOTE — Telephone Encounter (Addendum)
Called patient regarding wound check appointment scheduling.  Patient states that she had a wound check and device check at [facility] today in their device clinic.  She states that [facility] is much more convenient for her.  Advised patient that typically we see patients back for their wound check and their 91 day follow-up appointment with the MD prior to patient's transferring their care elsewhere.  Patient's husband states that she sees Dr. Ulyess Mort and that her device will be monitored by an MD at Dr. Jaynee Eagles office "coming up from [place] every so often".  Advised patient and husband that I would discuss with Dr. Ladona Ridgel and call them back.  Reviewed home monitoring transmissions with Dr. Ladona Ridgel.  Patient's P-waves have been trending 0.62mV-0.5mV for past few days, compared with 2.62mV at implant.  Dr. Ladona Ridgel advised that patient should have a chest x-ray done and follow-up in clinic next week.  Called patient back, she asked me to speak with her husband.  Advised patient's husband that per remote transmissions, Dr. Ladona Ridgel recommended following-up on lead placement.  After much discussion, patient and husband are agreeable to coming to [facility] for a chest x-ray on Tuesday, 08/21/15, and coming back for an appointment with Yevette Edwards, PA on 08/23/15 at 8:00am to discuss the results (and to have a wound check at our clinic).  Patient's husband wrote down both location addresses and phone numbers if they have any questions or concerns.  He verbalizes understanding of all instructions and denies additional questions or concerns at this time.

## 2015-08-20 ENCOUNTER — Ambulatory Visit: Payer: Medicare Other

## 2015-08-21 ENCOUNTER — Ambulatory Visit (HOSPITAL_COMMUNITY)
Admission: RE | Admit: 2015-08-21 | Discharge: 2015-08-21 | Disposition: A | Discharge status: Home / Self Care | Payer: Medicare Other | Source: Ambulatory Visit | Attending: Internal Medicine | Admitting: Internal Medicine

## 2015-08-21 DIAGNOSIS — Z950 Presence of cardiac pacemaker: Secondary | ICD-10-CM | POA: Insufficient documentation

## 2015-08-22 NOTE — Progress Notes (Signed)
Cardiology Office Note Date:  08/23/2015  Patient ID:  Destiny Lutz January 30, 1946, MRN [id number] PCP:  No PCP Per Patient  Cardiologist:  Dr. Kirke Corin, new to [facility] Electrophysiologist: Dr. Ladona Ridgel   Chief Complaint: post PPM implant  History of Present Illness: Destiny Lutz is a 70 y.o. female with history of syncope associated with bradycardia/asystole and CHB with a component of vasovagal as well, s/p PPM, HTN comes to the office today seen for Dr. Ladona Ridgel post hospiatl/pacer implant.  Remote trannsmissios post implant have noted a trend down on her P wave sensing and was sent for CXR and comes in for f/u.  She had a wound check and device check in-clinic at [facility] 08/17/15.  She is feeling well, denies any kind of CP, palpitations or SOB, no dizziness, near syncope or syncope.  During her hospital presentation she had c/o transient facial numbness and was implanted with an MRI compatible device to have neurology f/u out patient, any MRI will need to wait post PPM implant 6 weeks.  She is reminded to f/u with her PMD and that she needs to wait on any MR evaluations for the 6 week period post implant.   Past Medical History  Diagnosis Date  . Hypertension   . Bradycardia   . Depression   . Anemia   . Presence of permanent cardiac pacemaker 08/07/2015    Past Surgical History  Procedure Laterality Date  . Hernia repair    . Abdominal hysterectomy    . Rotator cuff repair Right   . Cardiac catheterization N/A 08/06/2015    Procedure: Left Heart Cath and Coronary Angiography and temp wire placement;  Surgeon: Iran Ouch, MD;  Location: [facility] INVASIVE CV LAB;  Service: Cardiovascular;  Laterality: N/A;  . Ep implantable device N/A 08/07/2015    Procedure: Pacemaker Implant;  Surgeon: Marinus Maw, MD;  Location: [facility] INVASIVE CV LAB;  Service: Cardiovascular;  Laterality: N/A;  . Insert / replace / remove pacemaker      Current Outpatient Prescriptions  Medication Sig Dispense  Refill  . aspirin EC 81 MG tablet Take 81 mg by mouth daily.    . pantoprazole (PROTONIX) 40 MG tablet Take 40 mg by mouth daily.  5  . verapamil (CALAN-SR) 240 MG CR tablet Take 240 mg by mouth daily.  4  . zolpidem (AMBIEN) 10 MG tablet Take 10 mg by mouth at bedtime.  0   No current facility-administered medications for this visit.    Allergies:   Review of patient's allergies indicates no known allergies.   Social History:  The patient  reports that she has never smoked. She has never used smokeless tobacco. She reports that she does not drink alcohol or use illicit drugs.   Family History:  The patient's family history includes CAD in her mother; Diabetes in her mother; Hypertension in her mother; Lung cancer in her father.  ROS:  Please see the history of present illness.  All other systems are reviewed and otherwise negative.   PHYSICAL EXAM:  VS:  BP 130/82 mmHg  Pulse 72  Ht 5\' 5"  (1.651 m)  Wt 164 lb 3.2 oz (74.481 kg)  BMI 27.32 kg/m2 BMI: Body mass index is 27.32 kg/(m^2). Well nourished, well developed, in no acute distress HEENT: normocephalic, atraumatic Neck: no JVD, carotid bruits or masses Cardiac:  normal S1, S2; RRR; no significant murmurs, no rubs, or gallops Lungs:  clear to auscultation bilaterally, no wheezing, rhonchi or rales Abd: soft,  nontender MS: no deformity or atrophy Ext: no edema Skin: warm and dry, no rash Neuro:  No gross deficits appreciated Psych: euthymic mood, full affect  PPM site is stable, wound edges are well approximated, no erythema, edema or drainage  EKG:  Done today shows A pacing, RBBB, LAFB PPM interrogation today: shows normal device function, A lead sensing particularly is good, device is functioning normally, she ad 2 very brief HVR episodes, reviewed with Dr. Ladona Ridgel, SVT 15 and 18 seconds   08/06/15: Echocardiogram Study Conclusions - Left ventricle: The cavity size was normal. There was mild concentric hypertrophy.  Systolic function was normal. The estimated ejection fraction was in the range of 60% to 65%. Wall motion was normal; there were no regional wall motion abnormalities. Doppler parameters are consistent with abnormal left ventricular relaxation (grade 1 diastolic dysfunction). - Left atrium: The atrium was mildly dilated. - Right atrium: The atrium was mildly dilated. - Pulmonary arteries: Systolic pressure was within the normal range  08/06/15: LHC Conclusion     Ost 2nd Diag to 2nd Diag lesion, 80% stenosed.  Prox RCA lesion, 10% stenosed.  1. No evidence of obstructive coronary artery disease. There is only an 80% stenosis in a small diagonal branch. 2. Normal LV systolic function by echocardiogram. Normal left ventricular end-diastolic pressure. 3. Successful temporary pacemaker placement via the right femoral vein.  Recommendations: I consulted Dr. Graciela Husbands for a permanent pacemaker placement. The patient has been on verapamil for a long time and I don't think this is the culprit. 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     Recent Labs: 08/06/2015: BUN 16; Creatinine, Ser 0.60; Magnesium 1.9; Potassium 3.8; Sodium 138; TSH 4.672* 08/08/2015: Hemoglobin 10.8*; Platelets 194  No results found for requested labs within last 365 days.   Estimated Creatinine Clearance: 67.1 mL/min (by C-G formula based on Cr of 0.6).   Wt Readings from Last 3 Encounters:  08/23/15 164 lb 3.2 oz (74.481 kg)  08/08/15 163 lb 2.3 oz (74 kg)  08/06/15 163 lb 2.3 oz (74 kg)     Other studies reviewed: Additional studies/records reviewed today include: summarized above  DEVICE information: Biotronik (serial number [id number]) dual chamber pacemaker, implanted 08/07/15 by Dr. Ladona Ridgel   ASSESSMENT AND PLAN:  1. Sinus node dysfunction/CHB     Suspect vasovagal syncope     PPM/CLS device     site is  healing well      Leads status are stable     Dr. Ladona Ridgel had reveiewed her CXR felt lead placement appears stable from implant     She is reminded of ROM/LUE instructions post PPM implant  2. CAD     Branch vessel disease, no interventions     no CP     Will defer to her primary MD lipid managment  3. HTN     Appears well controlled  4. Hx of c/o palpitations, facial numbness     f/u PMD for neuro eval, MR will need to wait 6 week post PPM implant     She had very brief SVT only on her device, no AMS or AF     monitor for any arrhythmias via her device   Disposition: F/u with Dr. Ladona Ridgel in 3 months as planned, she lives about an hour from here, is happy to come back for 91 day visit, though likely will transfer her longterm care to a  cardiologist locally for her.  Current medicines are reviewed at length with the patient today.  The patient did not have any concerns regarding medicines.  Judith Blonder, PA-C 08/23/2015 8:30 AM     [facility] [address] [place] [address] [phone] (office)  [phone] (fax)

## 2015-08-23 ENCOUNTER — Encounter: Payer: Self-pay | Admitting: Physician Assistant

## 2015-08-23 ENCOUNTER — Ambulatory Visit (INDEPENDENT_AMBULATORY_CARE_PROVIDER_SITE_OTHER): Payer: Medicare Other | Admitting: Physician Assistant

## 2015-08-23 VITALS — BP 130/82 | HR 72 | Ht 65.0 in | Wt 164.2 lb

## 2015-08-23 DIAGNOSIS — I10 Essential (primary) hypertension: Secondary | ICD-10-CM

## 2015-08-23 DIAGNOSIS — R001 Bradycardia, unspecified: Secondary | ICD-10-CM

## 2015-08-23 DIAGNOSIS — I442 Atrioventricular block, complete: Secondary | ICD-10-CM

## 2015-08-23 NOTE — Patient Instructions (Signed)
Medication Instructions:   Your physician recommends that you continue on your current medications as directed. Please refer to the Current Medication list given to you today.   If you need a refill on your cardiac medications before your next appointment, please call your pharmacy.  Labwork:  NONE ORDER TODAY    Testing/Procedures:  NONE ORDER TODAY    Follow-Up: WITH DR Ladona Ridgel    Any Other Special Instructions Will Be Listed Below (If Applicable).

## 2015-09-07 ENCOUNTER — Encounter: Payer: Self-pay | Admitting: Internal Medicine

## 2015-09-20 ENCOUNTER — Telehealth: Payer: Self-pay | Admitting: Internal Medicine

## 2015-09-20 NOTE — Telephone Encounter (Signed)
New message     Need remote release from the website patient will be seeing in [place].

## 2015-09-20 NOTE — Telephone Encounter (Signed)
Spoke with Destiny Lutz. She will be establishing care of her pacemaker in [place] after her 11/06/15 appt with Dr. Ladona Ridgelaylor. She confirms that she will be coming to that appt. I will release Home Monitoring to [facility] after that appt.   Shawn made aware of plan to release Destiny Lutz from Home Monitoring to them 11/06/15.

## 2015-11-06 ENCOUNTER — Encounter: Payer: Self-pay | Admitting: Internal Medicine

## 2015-11-06 ENCOUNTER — Encounter: Payer: Medicare Other | Admitting: Internal Medicine

## 2015-11-06 ENCOUNTER — Ambulatory Visit (INDEPENDENT_AMBULATORY_CARE_PROVIDER_SITE_OTHER): Payer: Medicare Other | Admitting: Internal Medicine

## 2015-11-06 VITALS — BP 142/96 | HR 79 | Ht 65.0 in | Wt 159.4 lb

## 2015-11-06 DIAGNOSIS — I480 Paroxysmal atrial fibrillation: Secondary | ICD-10-CM

## 2015-11-06 DIAGNOSIS — R001 Bradycardia, unspecified: Secondary | ICD-10-CM | POA: Diagnosis not present

## 2015-11-06 MED ORDER — RIVAROXABAN 20 MG PO TABS: 20.0000 mg | ORAL_TABLET | Freq: Every day | ORAL | Status: AC

## 2015-11-06 NOTE — Progress Notes (Signed)
HPI Destiny Lutz returns today for ongoing PPM followup. She is a pleasant 70 yo woman with symptomatic bradycardia due to sinus node dysfunction, s/p PPM insertion. She has been stable in the interim but admits to some anxiety. No chest pain or sob. No edema. No syncope. She is living near the [place] region. No Known Allergies   Current Outpatient Prescriptions  Medication Sig Dispense Refill  . pantoprazole (PROTONIX) 40 MG tablet Take 40 mg by mouth daily.  5  . PARoxetine (PAXIL) 20 MG tablet Take 1 tablet by mouth daily.    . verapamil (CALAN-SR) 240 MG CR tablet Take 240 mg by mouth daily.  4  . zolpidem (AMBIEN) 10 MG tablet Take 10 mg by mouth at bedtime.  0  . rivaroxaban (XARELTO) 20 MG TABS tablet Take 1 tablet (20 mg total) by mouth daily with supper. 30 tablet 11   No current facility-administered medications for this visit.     Past Medical History  Diagnosis Date  . Hypertension   . Bradycardia   . Depression   . Anemia   . Presence of permanent cardiac pacemaker 08/07/2015    ROS:   All systems reviewed and negative except as noted in the HPI.   Past Surgical History  Procedure Laterality Date  . Hernia repair    . Abdominal hysterectomy    . Rotator cuff repair Right   . Cardiac catheterization N/A 08/06/2015    Procedure: Left Heart Cath and Coronary Angiography and temp wire placement;  Surgeon: Destiny Ouch, MD;  Location: [facility] INVASIVE CV LAB;  Service: Cardiovascular;  Laterality: N/A;  . Ep implantable device N/A 08/07/2015    Procedure: Pacemaker Implant;  Surgeon: Destiny Maw, MD;  Location: [facility] INVASIVE CV LAB;  Service: Cardiovascular;  Laterality: N/A;  . Insert / replace / remove pacemaker       Family History  Problem Relation Age of Onset  . CAD Mother   . Hypertension Mother   . Diabetes Mother   . Lung cancer Father      Social History   Social History  . Marital Status: Married    Spouse Name: N/A  . Number of  Children: N/A  . Years of Education: N/A   Occupational History  . Not on file.   Social History Main Topics  . Smoking status: Never Smoker   . Smokeless tobacco: Never Used  . Alcohol Use: No  . Drug Use: No  . Sexual Activity: Not on file   Other Topics Concern  . Not on file   Social History Narrative     BP 142/96 mmHg  Pulse 79  Ht  (1.651 m)  Wt 159 lb 6.4 oz (72.303 kg)  BMI 26.53 kg/m2  Physical Exam:  Anxious appearing 70 yo man, NAD HEENT: Unremarkable Neck:  7 cm JVD, no thyromegally Lymphatics:  No adenopathy Back:  No CVA tenderness Lungs:  Clear with no wheezes HEART:  Regular rate rhythm, no murmurs, no rubs, no clicks Abd:  soft, positive bowel sounds, no organomegally, no rebound, no guarding Ext:  2 plus pulses, no edema, no cyanosis, no clubbing Skin:  No rashes no nodules Neuro:  CN II through XII intact, motor grossly intact  EKG - nsr with atrial pacing and RBBB  DEVICE  Normal device function.  See PaceArt for details. Atrial fib noted  Assess/Plan: 1. PAF - she has had an episode lasting over 90 minutes. I  have recommended she be placed on systemic anti-coagulation. She prefers once daily dosing. Will start Xarelto. She is asymptomatic. 2. HTN - her blood pressure is up a bit. She states that she is anxious and that it is usually well controlled. 3. PPM - Her biotronik DDD PM is working normally. Will follow. 4. Sinus node dysfunction - she is stable, s/p PPM.  Destiny Lutz,M.D.

## 2015-11-06 NOTE — Patient Instructions (Addendum)
Medication Instructions:  Your physician has recommended you make the following change in your medication:  1) Stop Aspirin 2) Start Xarelto 20 mg daily   Labwork: None ordered   Testing/Procedures: None ordered   Follow-Up: Your physician recommends that you schedule a follow-up appointment as needed.   Will follow up with [facility]   Any Other Special Instructions Will Be Listed Below (If Applicable).     If you need a refill on your cardiac medications before your next appointment, please call your pharmacy.

## 2015-11-07 LAB — CUP PACEART INCLINIC DEVICE CHECK
Date Time Interrogation Session: 20170509140800
Implantable Lead Implant Date: 20170207
Implantable Lead Model: 377
Implantable Lead Serial Number: 49396262
Lead Channel Impedance Value: 565 Ohm
Lead Channel Pacing Threshold Amplitude: 0.7 V
Lead Channel Pacing Threshold Amplitude: 0.8 V
Lead Channel Pacing Threshold Pulse Width: 0.4 ms
Lead Channel Pacing Threshold Pulse Width: 0.4 ms
Lead Channel Sensing Intrinsic Amplitude: 3.6 mV
Lead Channel Sensing Intrinsic Amplitude: 9.6 mV
Lead Channel Sensing Intrinsic Amplitude: 9.7 mV
Lead Channel Setting Pacing Pulse Width: 0.4 ms
MDC IDC LEAD IMPLANT DT: 20170207
MDC IDC LEAD LOCATION: 753859
MDC IDC LEAD LOCATION: 753860
MDC IDC LEAD SERIAL: 49355994
MDC IDC MSMT LEADCHNL RA PACING THRESHOLD AMPLITUDE: 0.9 V
MDC IDC MSMT LEADCHNL RA PACING THRESHOLD AMPLITUDE: 1 V
MDC IDC MSMT LEADCHNL RA PACING THRESHOLD AMPLITUDE: 1 V
MDC IDC MSMT LEADCHNL RA PACING THRESHOLD PULSEWIDTH: 0.4 ms
MDC IDC MSMT LEADCHNL RA PACING THRESHOLD PULSEWIDTH: 0.4 ms
MDC IDC MSMT LEADCHNL RA SENSING INTR AMPL: 4.5 mV
MDC IDC MSMT LEADCHNL RV IMPEDANCE VALUE: 721 Ohm
MDC IDC MSMT LEADCHNL RV PACING THRESHOLD AMPLITUDE: 0.8 V
MDC IDC MSMT LEADCHNL RV PACING THRESHOLD AMPLITUDE: 0.8 V
MDC IDC MSMT LEADCHNL RV PACING THRESHOLD AMPLITUDE: 0.8 V
MDC IDC MSMT LEADCHNL RV PACING THRESHOLD AMPLITUDE: 0.8 V
MDC IDC MSMT LEADCHNL RV PACING THRESHOLD PULSEWIDTH: 0.4 ms
MDC IDC MSMT LEADCHNL RV PACING THRESHOLD PULSEWIDTH: 0.4 ms
MDC IDC MSMT LEADCHNL RV PACING THRESHOLD PULSEWIDTH: 0.4 ms
MDC IDC MSMT LEADCHNL RV PACING THRESHOLD PULSEWIDTH: 0.4 ms
MDC IDC MSMT LEADCHNL RV PACING THRESHOLD PULSEWIDTH: 0.4 ms
MDC IDC SET LEADCHNL RA PACING AMPLITUDE: 2.4 V
MDC IDC SET LEADCHNL RV PACING AMPLITUDE: 2.4 V
Pulse Gen Model: 394929
Pulse Gen Serial Number: 68699791

## 2016-06-26 IMAGING — DX DG CHEST 2V
2 series · 2 of 2 positions shown · non-contrast
Comparison: 08/08/2015 and 08/06/2015

CLINICAL DATA: Right atrial lead position evaluation

EXAM:
CHEST  2 VIEW

[chest pa]
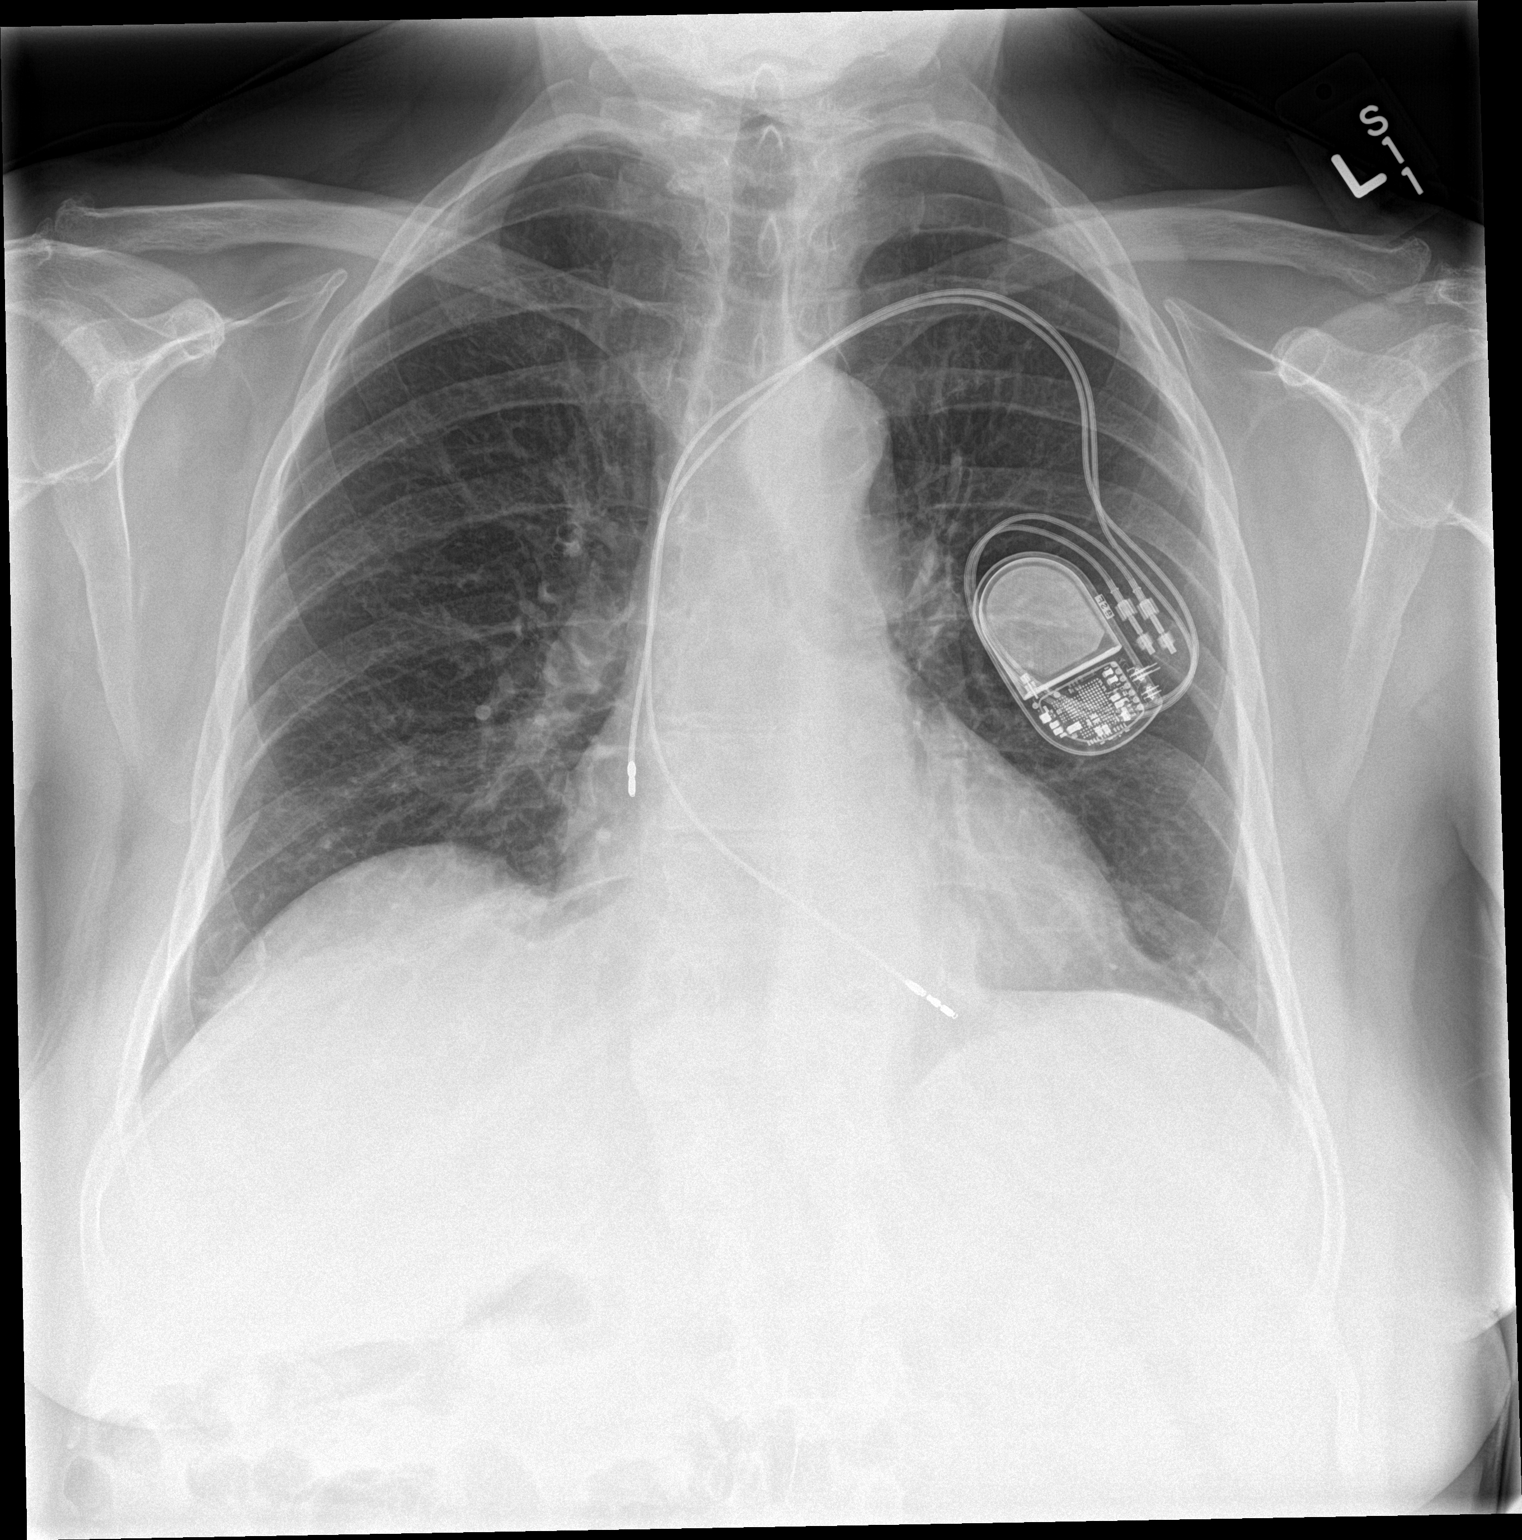

[chest lat]
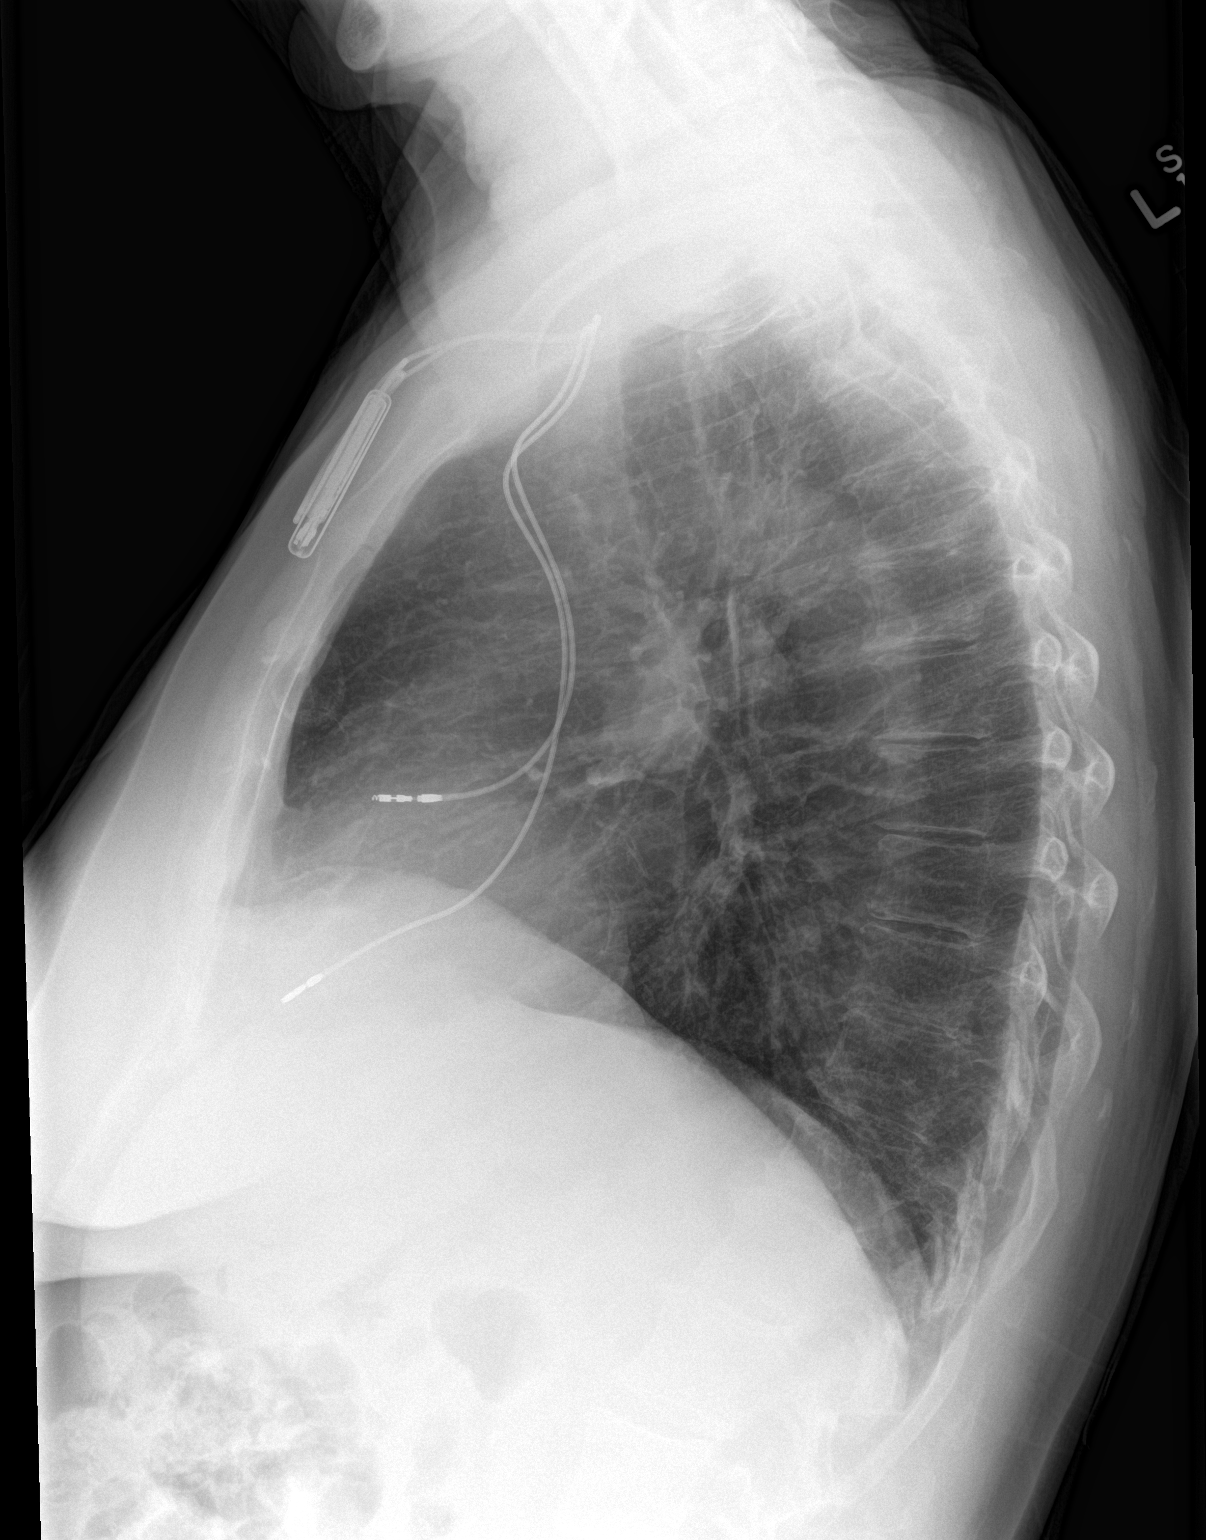

[2 of 2 positions shown; findings below may reference images not displayed]

FINDINGS: Heart size is upper normal and stable. There is atherosclerotic
calcification of the thoracic aorta contour. Left chest wall
permanent pacemaker generator noted. A right atrial lead and a right
ventricular lead appear stable and satisfactory in position.
Specifically, the right atrial lead is directed anteriorly within
the right atrium. No complicating features identified.

The lungs are well expanded and clear. Negative for pleural effusion
or pneumothorax. A lucent, fatty mass with a visible thin peripheral
radiodense rim is noted near the anterior right costophrenic angle
and unchanged from prior studies. This measures approximately 2 cm
in size and has benign appearances. This could possibly be a focal
area of fat necrosis.
IMPRESSION: Stable and satisfactory position of dual lead pacemaker. Leads
terminate in the right atrium and right ventricle. No acute
cardiopulmonary disease.

## 2024-07-31 DEATH — deceased
